# Patient Record
Sex: Female | Born: 1965 | Race: Black or African American | Hispanic: No | Marital: Married | Smoking: Current every day smoker
Health system: Southern US, Community
[De-identification: ages and names within clinical notes are randomized; demographics above are authoritative.]

## PROBLEM LIST (undated history)

## (undated) DIAGNOSIS — I10 Essential (primary) hypertension: Secondary | ICD-10-CM

## (undated) HISTORY — PX: CARPAL TUNNEL RELEASE: SHX101

## (undated) HISTORY — PX: TUBAL LIGATION: SHX77

---

## 2010-12-07 DIAGNOSIS — Z780 Asymptomatic menopausal state: Secondary | ICD-10-CM | POA: Insufficient documentation

## 2010-12-07 DIAGNOSIS — F17200 Nicotine dependence, unspecified, uncomplicated: Secondary | ICD-10-CM | POA: Insufficient documentation

## 2012-12-07 DIAGNOSIS — I10 Essential (primary) hypertension: Secondary | ICD-10-CM | POA: Insufficient documentation

## 2013-12-12 DIAGNOSIS — T502X5A Adverse effect of carbonic-anhydrase inhibitors, benzothiadiazides and other diuretics, initial encounter: Secondary | ICD-10-CM

## 2013-12-12 DIAGNOSIS — E876 Hypokalemia: Secondary | ICD-10-CM | POA: Insufficient documentation

## 2016-02-26 DIAGNOSIS — E669 Obesity, unspecified: Secondary | ICD-10-CM | POA: Insufficient documentation

## 2016-02-26 DIAGNOSIS — K581 Irritable bowel syndrome with constipation: Secondary | ICD-10-CM | POA: Insufficient documentation

## 2016-07-07 DIAGNOSIS — J302 Other seasonal allergic rhinitis: Secondary | ICD-10-CM | POA: Insufficient documentation

## 2017-04-12 ENCOUNTER — Ambulatory Visit
Admission: EM | Admit: 2017-04-12 | Discharge: 2017-04-12 | Disposition: A | Discharge status: Home / Self Care | Payer: BLUE CROSS/BLUE SHIELD | Attending: Family Medicine | Admitting: Family Medicine

## 2017-04-12 ENCOUNTER — Encounter: Payer: Self-pay | Admitting: Emergency Medicine

## 2017-04-12 ENCOUNTER — Other Ambulatory Visit: Payer: Self-pay

## 2017-04-12 DIAGNOSIS — M5442 Lumbago with sciatica, left side: Secondary | ICD-10-CM | POA: Diagnosis not present

## 2017-04-12 HISTORY — DX: Essential (primary) hypertension: I10

## 2017-04-12 MED ORDER — CYCLOBENZAPRINE HCL 10 MG PO TABS: 10.0000 mg | ORAL_TABLET | Freq: Two times a day (BID) | ORAL | 0 refills | Status: DC | PRN

## 2017-04-12 MED ORDER — MELOXICAM 15 MG PO TABS: 15.0000 mg | ORAL_TABLET | Freq: Every day | ORAL | 0 refills | Status: DC | PRN

## 2017-04-12 NOTE — ED Provider Notes (Addendum)
[facility] ____________________________________________  Time seen: Approximately 0954 AM  I have reviewed the triage vital signs and the nursing notes.   HISTORY  Chief Complaint Back Pain   HPI Jamie Estrada is a 52 y.o. female presenting for evaluation of left lower back pain that is been present since Monday when she woke up, with intermittent radiation down left leg.  States that she has a history of some similar pain happening in the past, but reports this feels like it is lasting longer.  States she has been exercising more recently and was last exercising on Sunday.  Denies any fall, direct injury or direct trauma.  States she woke up with some pain in the same area on Monday, but also reports Monday morning she coughed really hard and felt like she pulled her back additionally.  States pain going down left leg is intermittent, not constant.  Denies any urinary or bowel retention or incontinence, paresthesias, decreased range of motion, extremity swelling or rash.  Reports otherwise feels well.  Reports continues to eat and drink well.  States has taken some over-the-counter Excedrin occasionally without much change.  States pain improves with rest, increases with bending. Denies chest pain, shortness of breath, abdominal pain, dysuria, extremity swelling or rash. Denies recent sickness. Denies recent antibiotic use.  Denies renal insufficiency.  Denies cardiac history.   Past Medical History:  Diagnosis Date  . Hypertension     There are no active problems to display for this patient.   Past Surgical History:  Procedure Laterality Date  . CARPAL TUNNEL RELEASE    . TUBAL LIGATION       No current facility-administered medications for this encounter.   Current Outpatient Medications:  .  losartan-hydrochlorothiazide (HYZAAR) 50-12.5 MG tablet, Take 1 tablet by mouth daily., Disp: , Rfl:  .  potassium chloride SA (K-DUR,KLOR-CON) 20 MEQ tablet, Take 20 mEq  by mouth once., Disp: , Rfl:  .  cyclobenzaprine (FLEXERIL) 10 MG tablet, Take 1 tablet (10 mg total) by mouth 2 (two) times daily as needed for muscle spasms. Do not drive while taking as can cause drowsiness, Disp: 15 tablet, Rfl: 0 .  meloxicam (MOBIC) 15 MG tablet, Take 1 tablet (15 mg total) by mouth daily as needed., Disp: 10 tablet, Rfl: 0  Allergies Patient has no known allergies.  Family History  Problem Relation Age of Onset  . Diabetes Mother   . Heart attack Mother   . Hypertension Father     Social History Social History   Tobacco Use  . Smoking status: Current Every Day Smoker  . Smokeless tobacco: Never Used  . Tobacco comment: 2 cigarettes/day  Substance Use Topics  . Alcohol use: Yes    Comment: socially  . Drug use: No    Review of Systems Constitutional: No fever/chills Cardiovascular: Denies chest pain. Respiratory: Denies shortness of breath. Gastrointestinal: No abdominal pain.  Genitourinary: Negative for dysuria. Musculoskeletal: Positive for back pain. Skin: Negative for rash.  ____________________________________________   PHYSICAL EXAM:  VITAL SIGNS: ED Triage Vitals [04/12/17 0920]  Enc Vitals Group     BP 127/68     Pulse Rate 67     Resp 16     Temp (!) 97.4 F (36.3 C)     Temp Source Oral     SpO2 100 %     Weight 220 lb (99.8 kg)     Height 5\' 5"  (1.651 m)     Head Circumference  Peak Flow      Pain Score 10     Pain Loc      Pain Edu?      Excl. in [place]?     Constitutional: Alert and oriented. Well appearing and in no acute distress. Cardiovascular: Normal rate, regular rhythm. Grossly normal heart sounds.  Good peripheral circulation. Respiratory: Normal respiratory effort without tachypnea nor retractions. Breath sounds are clear and equal bilaterally. No wheezes, rales, rhonchi. Gastrointestinal: Soft and nontender. N Musculoskeletal:   No midline cervical, thoracic or lumbar tenderness to palpation. Bilateral  pedal pulses equal and easily palpated.      Right lower leg:  No tenderness or edema.      Left lower leg:  No tenderness or edema.  Except: No midline lumbar tenderness palpation, mild to moderate left paralumbar tenderness, moderate tenderness along the left piriformis and sciatic notch, no rash, no lesions, pain radiation present with palpation to sciatic notch, no saddle anesthesia, changes positions quickly in room, mild pain with standing left knee lift, no pain with right knee lift, bilateral plantar flexion dorsiflexion strong and equal, ambulatory with steady gait,   No pain with lumbar right or left rotation, mild pain with lumbar flexion and extension with slightly limited flexion. Neurologic:  Normal speech and language. No gross focal neurologic deficits are appreciated. Speech is normal. No gait instability.  Skin:  Skin is warm, dry and intact. No rash noted. Psychiatric: Mood and affect are normal. Speech and behavior are normal. Patient exhibits appropriate insight and judgment   ___________________________________________   LABS (all labs ordered are listed, but only abnormal results are displayed)  Labs Reviewed - No data to display  RADIOLOGY  No results found. ____________________________________________   PROCEDURES Procedures   INITIAL IMPRESSION / ASSESSMENT AND PLAN / ED COURSE  Pertinent labs & imaging results that were available during my care of the patient were reviewed by me and considered in my medical decision making (see chart for details).  Well-appearing patient.  No acute distress.  Suspect recent overuse from exercise and straining injury leading to inflammation sciatica.  Will treat patient oral daily Mobic and as needed Flexeril.  Encourage rest, fluids, supportive care, stretching.Discussed indication, risks and benefits of medications with patient.  Discussed follow up with Primary care physician this week. Discussed follow up and return  parameters including no resolution or any worsening concerns. Patient verbalized understanding and agreed to plan.   ____________________________________________   FINAL CLINICAL IMPRESSION(S) / ED DIAGNOSES  Final diagnoses:  Acute left-sided low back pain with left-sided sciatica     ED Discharge Orders        Ordered    meloxicam (MOBIC) 15 MG tablet  Daily PRN     04/12/17 1010    cyclobenzaprine (FLEXERIL) 10 MG tablet  2 times daily PRN     04/12/17 1010       Note: This dictation was prepared with Dragon dictation along with smaller phrase technology. Any transcriptional errors that result from this process are unintentional.         Marylene Land, NP 04/12/17 Culbertson, Lindy, NP 04/12/17 1042

## 2017-04-12 NOTE — ED Triage Notes (Signed)
Patient in today with a 2 day history of left sided back pain radiating into her left leg. Patient has tried OTC Excedrine and Ibuprofen without relief.

## 2017-04-12 NOTE — Discharge Instructions (Signed)
Take medication as prescribed. Rest. Drink plenty of fluids.  ° °Follow up with your primary care physician this week as needed. Return to Urgent care for new or worsening concerns.  ° °

## 2017-04-15 ENCOUNTER — Telehealth: Payer: Self-pay

## 2017-04-15 NOTE — Telephone Encounter (Signed)
Called to follow up with patient since visit here at [facility]. Patient instructed to call back with any questions or concerns. MAH  

## 2017-06-07 ENCOUNTER — Other Ambulatory Visit: Payer: Self-pay | Admitting: Podiatry

## 2017-06-07 DIAGNOSIS — D480 Neoplasm of uncertain behavior of bone and articular cartilage: Secondary | ICD-10-CM

## 2017-06-27 ENCOUNTER — Ambulatory Visit
Admission: RE | Admit: 2017-06-27 | Discharge: 2017-06-27 | Disposition: A | Discharge status: Home / Self Care | Payer: BLUE CROSS/BLUE SHIELD | Source: Ambulatory Visit | Attending: Podiatry | Admitting: Podiatry

## 2017-06-27 ENCOUNTER — Encounter (INDEPENDENT_AMBULATORY_CARE_PROVIDER_SITE_OTHER): Payer: Self-pay

## 2017-06-27 DIAGNOSIS — D480 Neoplasm of uncertain behavior of bone and articular cartilage: Secondary | ICD-10-CM | POA: Diagnosis present

## 2017-06-27 DIAGNOSIS — M79671 Pain in right foot: Secondary | ICD-10-CM | POA: Insufficient documentation

## 2017-07-12 ENCOUNTER — Other Ambulatory Visit: Payer: Self-pay | Admitting: Podiatry

## 2017-07-14 ENCOUNTER — Encounter: Payer: Self-pay | Admitting: Emergency Medicine

## 2017-07-14 ENCOUNTER — Ambulatory Visit
Admission: EM | Admit: 2017-07-14 | Discharge: 2017-07-14 | Disposition: A | Discharge status: Home / Self Care | Payer: BLUE CROSS/BLUE SHIELD | Attending: Emergency Medicine | Admitting: Emergency Medicine

## 2017-07-14 ENCOUNTER — Other Ambulatory Visit: Payer: Self-pay

## 2017-07-14 DIAGNOSIS — R1032 Left lower quadrant pain: Secondary | ICD-10-CM

## 2017-07-14 DIAGNOSIS — K5900 Constipation, unspecified: Secondary | ICD-10-CM | POA: Diagnosis not present

## 2017-07-14 LAB — URINALYSIS, COMPLETE (UACMP) WITH MICROSCOPIC
BACTERIA UA: NONE SEEN
Bilirubin Urine: NEGATIVE
Glucose, UA: NEGATIVE mg/dL
Hgb urine dipstick: NEGATIVE
Leukocytes, UA: NEGATIVE
Nitrite: NEGATIVE
PH: 5 (ref 5.0–8.0)
Protein, ur: NEGATIVE mg/dL
RBC / HPF: NONE SEEN RBC/hpf (ref 0–5)
SPECIFIC GRAVITY, URINE: 1.025 (ref 1.005–1.030)

## 2017-07-14 MED ORDER — GLYCERIN (ADULT) 2 G RE SUPP: 1.0000 | Freq: Once | RECTAL | 0 refills | Status: DC | PRN

## 2017-07-14 MED ORDER — SENNOSIDES-DOCUSATE SODIUM 8.6-50 MG PO TABS: 1.0000 | ORAL_TABLET | Freq: Every day | ORAL | 0 refills | Status: DC

## 2017-07-14 NOTE — Discharge Instructions (Addendum)
Drink extra fluids. It may take up to 3 days for the miralax to take effect. may also drink prune and apple juice. Return to the ER if you have a fever, if you start having severe abdominal pain, a fever >100.4, or any other concerns.   Go to www.goodrx.com to look up your medications. This will give you a list of where you can find your prescriptions at the most affordable prices. Or ask the pharmacist what the cash price is, or if they have any other discount programs available to help make your medication more affordable. This can be less expensive than what you would pay with insurance.

## 2017-07-14 NOTE — ED Triage Notes (Signed)
Patient states she is having bad abdominal pain and has been since Monday

## 2017-07-14 NOTE — ED Provider Notes (Signed)
HPI  SUBJECTIVE:  Jamie Estrada is a 52 y.o. female who presents with 5 days of midline left lower quadrant aching, stabbing, constant abdominal/pelvic pain.  States that it waxes and wanes.  States her belly feels swollen.  States that she has had constipation for the past 5 days.  She last passed stool 5 days ago.  States that she passed only a small amount of hard stool.   She reports urinary frequency, denies dysuria, urgency, cloudy odorous urine, hematuria.  No nausea, vomiting, fevers, anorexia, back pain.  No distention.  No vaginal bleeding, odor, discharge, rash.  She is in a long-term monogamous relationship with her husband who is asymptomatic.  STDs are not a concern today.  She tried ibuprofen with improvement in her symptoms.  She also tried MiraLAX once.  She states symptoms are worse with coughing and trying to stool.  She states that the car ride over here was not painful.  States that she gets constipated approximately once a month and states that this feels similar to previous episodes of constipation.  She drinks a liter and a half of water a day.  She has a past medical history of hypertension.  No history of diabetes, opiate use, abdominal surgeries, diverticulosis, UTI, pyelonephritis, nephrolithiasis.  ZOX:WRUEAVW, Santiago Glad, MD   Past Medical History:  Diagnosis Date  . Hypertension     Past Surgical History:  Procedure Laterality Date  . CARPAL TUNNEL RELEASE    . TUBAL LIGATION      Family History  Problem Relation Age of Onset  . Diabetes Mother   . Heart attack Mother   . Hypertension Father     Social History   Tobacco Use  . Smoking status: Current Every Day Smoker  . Smokeless tobacco: Never Used  . Tobacco comment: 2 cigarettes/day  Substance Use Topics  . Alcohol use: Yes    Comment: socially  . Drug use: No    No current facility-administered medications for this encounter.   Current Outpatient Medications:  .  losartan-hydrochlorothiazide  (HYZAAR) 50-12.5 MG tablet, Take 1 tablet by mouth daily., Disp: , Rfl:  .  potassium chloride SA (K-DUR,KLOR-CON) 20 MEQ tablet, Take 20 mEq by mouth once., Disp: , Rfl:  .  cyclobenzaprine (FLEXERIL) 10 MG tablet, Take 1 tablet (10 mg total) by mouth 2 (two) times daily as needed for muscle spasms. Do not drive while taking as can cause drowsiness, Disp: 15 tablet, Rfl: 0 .  glycerin adult 2 g suppository, Place 1 suppository rectally once as needed (constipation)., Disp: 12 suppository, Rfl: 0 .  meloxicam (MOBIC) 15 MG tablet, Take 1 tablet (15 mg total) by mouth daily as needed., Disp: 10 tablet, Rfl: 0 .  senna-docusate (SENOKOT-S) 8.6-50 MG tablet, Take 1 tablet by mouth daily., Disp: 10 tablet, Rfl: 0  No Known Allergies   ROS  As noted in HPI.   Physical Exam  BP 122/79 (BP Location: Left Arm)   Pulse 81   Temp 98.3 F (36.8 C) (Oral)   Resp 18   Ht 5\' 5"  (1.651 m)   Wt 220 lb (99.8 kg)   SpO2 100%   BMI 36.61 kg/m   Constitutional: Well developed, well nourished, no acute distress Eyes:  EOMI, conjunctiva normal bilaterally HENT: Normocephalic, atraumatic,mucus membranes moist Respiratory: Normal inspiratory effort Cardiovascular: Normal rate GI: Well appearance.  Soft, mild left lower quadrant tenderness.  No guarding, rebound.  Negative Murphy, negative McBurney.  Negative tap table test. Back: No CVAT  Rectal: Hard stool in vault.  No impaction. skin: No rash, skin intact Musculoskeletal: no deformities Neurologic: Alert & oriented x 3, no focal neuro deficits Psychiatric: Speech and behavior appropriate   ED Course   Medications - No data to display  Orders Placed This Encounter  Procedures  . Urinalysis, Complete w Microscopic    Standing Status:   Standing    Number of Occurrences:   1    Results for orders placed or performed during the hospital encounter of 07/14/17 (from the past 24 hour(s))  Urinalysis, Complete w Microscopic     Status:  Abnormal   Collection Time: 07/14/17  8:37 PM  Result Value Ref Range   Color, Urine YELLOW YELLOW   APPearance CLEAR CLEAR   Specific Gravity, Urine 1.025 1.005 - 1.030   pH 5.0 5.0 - 8.0   Glucose, UA NEGATIVE NEGATIVE mg/dL   Hgb urine dipstick NEGATIVE NEGATIVE   Bilirubin Urine NEGATIVE NEGATIVE   Ketones, ur TRACE (A) NEGATIVE mg/dL   Protein, ur NEGATIVE NEGATIVE mg/dL   Nitrite NEGATIVE NEGATIVE   Leukocytes, UA NEGATIVE NEGATIVE   Squamous Epithelial / LPF 0-5 0 - 5   WBC, UA 0-5 0 - 5 WBC/hpf   RBC / HPF NONE SEEN 0 - 5 RBC/hpf   Bacteria, UA NONE SEEN NONE SEEN   Mucus PRESENT    No results found.  ED Clinical Impression  Constipation, unspecified constipation type  Left lower quadrant pain   ED Assessment/Plan  Her abdomen is benign.  Her UA is negative for UTI.  Presentation is most consistent with constipation.  Advised MiraLAX, increase fluids, apple juice, will send home with glycerin suppositories, Senokot.  magnesium citrate if necessary.  Discussed labs,MDM, treatment plan, and plan for follow-up with patient. Discussed sn/sx that should prompt return to the ED. patient agrees with plan.   Meds ordered this encounter  Medications  . glycerin adult 2 g suppository    Sig: Place 1 suppository rectally once as needed (constipation).    Dispense:  12 suppository    Refill:  0  . senna-docusate (SENOKOT-S) 8.6-50 MG tablet    Sig: Take 1 tablet by mouth daily.    Dispense:  10 tablet    Refill:  0    *This clinic note was created using [employer]. Therefore, there may be occasional mistakes despite careful proofreading.   ?   Melynda Ripple, MD 07/14/17 2108

## 2017-08-08 ENCOUNTER — Other Ambulatory Visit: Payer: Self-pay | Admitting: Podiatry

## 2017-08-08 DIAGNOSIS — D1809 Hemangioma of other sites: Secondary | ICD-10-CM

## 2017-08-24 ENCOUNTER — Other Ambulatory Visit: Payer: BLUE CROSS/BLUE SHIELD

## 2017-09-21 ENCOUNTER — Other Ambulatory Visit: Payer: BLUE CROSS/BLUE SHIELD

## 2017-10-10 ENCOUNTER — Other Ambulatory Visit: Payer: BLUE CROSS/BLUE SHIELD

## 2017-11-02 DIAGNOSIS — F4323 Adjustment disorder with mixed anxiety and depressed mood: Secondary | ICD-10-CM | POA: Insufficient documentation

## 2017-11-08 ENCOUNTER — Other Ambulatory Visit: Payer: BLUE CROSS/BLUE SHIELD

## 2017-11-29 ENCOUNTER — Other Ambulatory Visit: Payer: BLUE CROSS/BLUE SHIELD

## 2017-12-14 ENCOUNTER — Ambulatory Visit
Admission: RE | Admit: 2017-12-14 | Discharge: 2017-12-14 | Disposition: A | Discharge status: Home / Self Care | Payer: BLUE CROSS/BLUE SHIELD | Source: Ambulatory Visit | Attending: Podiatry | Admitting: Podiatry

## 2017-12-14 ENCOUNTER — Encounter: Payer: Self-pay | Admitting: *Deleted

## 2017-12-14 DIAGNOSIS — D1809 Hemangioma of other sites: Secondary | ICD-10-CM

## 2017-12-14 HISTORY — PX: IR RADIOLOGIST EVAL & MGMT: IMG5224

## 2017-12-14 NOTE — Consult Note (Signed)
Chief Complaint: I have foot pain and swelling  Referring Physician(s): Fowler,Justin  History of Present Illness: Jamie Estrada is a 52 y.o. female presenting as a scheduled consultation today to Vascular [facility] Inter[facility] Radiology, kindly referred by Dr. Vickki Muff, for evaluation for right foot pain and MRI finding of a possible vascular malformation.   Ms Jamie Estrada is here today by herself for the interview.  She lives in [place], [place].    She tells me that she had right foot pain and a "spot" surgically resected from the right foot in 1999 at [facility] system by orthopedic surgery.  She is uncertain of the diagnosis at that time.  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    When I asked, she tells me that she does remember some vague memory of having right foot pain and swelling at the time of her child's pregnancy 30 years ago, that improved after giving birth.  She does not remember any other symptoms of her foot previously.   The symptoms persist today.  She has bilateral leg swelling, but the pain in the right foot is very debilitating.  She denies pain in the left foot.  She has compression stockings, and though not wearing them now, she tells me that she does wear them.   The pain is "12/10" intensity at its worst.  It is of burning, aching, sharp quality.  Worst at the end of the day.  The pain started 5-6 years after her surgery, and has been worsening over time.    She continues to smoke about 1/2 pack per day.  She has quit a few times but restarted.  She started smoking in her 30's.    Past Medical History:  Diagnosis Date  . Hypertension     Past Surgical History:  Procedure Laterality Date  . CARPAL TUNNEL RELEASE    . TUBAL LIGATION      Allergies: Patient has no  known allergies.  Medications: Prior to Admission medications   Medication Sig Start Date End Date Taking? Authorizing Provider  cyclobenzaprine (FLEXERIL) 10 MG tablet Take 1 tablet (10 mg total) by mouth 2 (two) times daily as needed for muscle spasms. Do not drive while taking as can cause drowsiness 04/12/17  Yes Marylene Land, NP  losartan-hydrochlorothiazide (HYZAAR) 50-12.5 MG tablet Take 1 tablet by mouth daily.   Yes [provider]  potassium chloride SA (K-DUR,KLOR-CON) 20 MEQ tablet Take 20 mEq by mouth once.   Yes [provider]  senna-docusate (SENOKOT-S) 8.6-50 MG tablet Take 1 tablet by mouth daily. 07/14/17  Yes Melynda Ripple, MD     Family History  Problem Relation Age of Onset  . Diabetes Mother   . Heart attack Mother   . Hypertension Father     Social History   Socioeconomic History  . Marital status: Married    Spouse name: Not on file  . Number of children: Not on file  . Years of education: Not on file  . Highest education level: Not on file  Occupational History  . Not on file  Social Needs  . Financial resource strain: Not on file  . Food insecurity:    Worry: Not on file    Inability: Not on file  . Transportation needs:  Medical: Not on file    Non-medical: Not on file  Tobacco Use  . Smoking status: Current Every Day Smoker  . Smokeless tobacco: Never Used  . Tobacco comment: 2 cigarettes/day  Substance and Sexual Activity  . Alcohol use: Yes    Comment: socially  . Drug use: No  . Sexual activity: Not on file  Lifestyle  . Physical activity:    Days per week: Not on file    Minutes per session: Not on file  . Stress: Not on file  Relationships  . Social connections:    Talks on phone: Not on file    Gets together: Not on file    Attends religious service: Not on file    Active member of club or organization: Not on file    Attends meetings of clubs or organizations: Not on file    Relationship status: Not  on file  Other Topics Concern  . Not on file  Social History Narrative  . Not on file     Review of Systems: A 12 point ROS discussed and pertinent positives are indicated in the HPI above.  All other systems are negative.  Review of Systems  Vital Signs: BP (!) 146/93   Pulse 85   Temp 98.1 F (36.7 C) (Oral)   Resp 15   Ht 5\' 5"  (1.651 m)   Wt 99.8 kg   SpO2 99%   BMI 36.61 kg/m   Physical Exam General: 52 yo AAfemale appearing younger than stated age.  Well-developed, well-nourished.  No distress. HEENT: Atraumatic, normocephalic.  Conjugate gaze, extra-ocular motor intact. No scleral icterus or scleral injection. No lesions on external ears, nose, lips, or gums.  Oral mucosa moist, pink. Wearing glasses.  Neck: Symmetric with no goiter enlargement.  Chest/Lungs:  Symmetric chest with inspiration/expiration.  No labored breathing.  Clear to auscultation with no wheezes, rhonchi, or rales.  Heart:  RRR, with no third heart sounds appreciated. No JVD appreciated.  Abdomen:  Soft, NT/ND, with + bowel sounds.   Genito-urinary: Deferred Neurologic: Alert & Oriented to person, place, and time.   Normal affect and insight.  Appropriate questions.  Moving all 4 extremities with gross sensory intact.  Pulse Exam:  No bruit appreciated.  Palpable radial and pedal pulses. Extremities: No wounds on the extremity.  Surgical scar on the dorsum of the right foot.  Right foot circumference: 23cm at the metatarsal heads 24.5cm at the instep Left foot circumference: 23cm at the metatarsal heads 24.5cm at the instep  No thrill palpated.  No bruit on the doppler at the forefoot.   Mallampati Score:     Imaging: No results found.  Labs:  CBC: No results for input(s): WBC, HGB, HCT, PLT in the last 8760 hours.  COAGS: No results for input(s): INR, APTT in the last 8760 hours.  BMP: No results for input(s): NA, K, CL, CO2, GLUCOSE, BUN, CALCIUM, CREATININE, GFRNONAA, GFRAA in  the last 8760 hours.  Invalid input(s): CMP  LIVER FUNCTION TESTS: No results for input(s): BILITOT, AST, ALT, ALKPHOS, PROT, ALBUMIN in the last 8760 hours.  TUMOR MARKERS: No results for input(s): AFPTM, CEA, CA199, CHROMGRNA in the last 8760 hours.  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    The majority of our discussion today was an overview of vascular malformations, the low-flow/high-flow categories, natural history,  and treatment options.  I suspect that if her lesion is a vascular malformation, it would be, in the ISSVA classification, a venous malformation or mixed venous/lymphatic malformation.  Given her prior surgery at this site, this could be post operative or a recurrence of a non-vascular lesion.  We do not have pathology of her prior resection.    Given her significant symptoms, she is motivated to get treated, if possible. I believe, then, it is indicated to plan for a venogram to study this lesion.  This may include an IV in the foot for reflux to the deep system, and/or direct puncture of the lesion for images.  I do not think that we will need to do an angiogram, as I am not suspecting any high-flow component if it is a vascular lesion.   I also discussed with her the plan for a second session to do the treatment, that would allow [place] time to study the images.  This is important to make sure we offer a low risk treatment.    Specific risks that were discussed regarding any potential percutaneous embolization/sclerotherpy that were discussed included: bleeding, infection, need for additional procedure, local inflammation, nerve injury, tissue injury/necrosis, further surgery, DVT or thromboembolic disease, need for hospitalization, cardiopulmonary collapse, death.    After our discussion, she would like to proceed with the planning venogram, and  potentially a treatment.    Plan: - plan for right lower extremity venogram, likely with foot IV and with US guided puncture of the lesion for images - plan for any future treatment on a later date - continue compression stocking therapy - I have advised her to observe all of her other doctors appointments  Thank you for this interesting consult.  I greatly enjoyed meeting Ebonique Sivils and look forward to participating in their care.  A copy of this report was sent to the requesting provider on this date.  Electronically Signed: Corrie Mckusick 12/14/2017, 3:36 PM   I spent a total of  40 Minutes   in face to face in clinical consultation, greater than 50% of which was counseling/coordinating care for right foot pain and swelling, possible right foot and leg venogram, with possible percutaneous embolization/sclerotherapy of vascular malformation

## 2017-12-20 ENCOUNTER — Other Ambulatory Visit (HOSPITAL_COMMUNITY): Payer: Self-pay | Admitting: Interventional Radiology

## 2017-12-20 DIAGNOSIS — M79671 Pain in right foot: Secondary | ICD-10-CM

## 2017-12-28 ENCOUNTER — Ambulatory Visit (HOSPITAL_COMMUNITY): Payer: BLUE CROSS/BLUE SHIELD

## 2018-01-02 ENCOUNTER — Telehealth (HOSPITAL_COMMUNITY): Payer: Self-pay

## 2018-01-02 NOTE — Telephone Encounter (Signed)
Called to schedule venogram, no answer, left vm. AW  

## 2018-01-11 ENCOUNTER — Other Ambulatory Visit: Payer: Self-pay | Admitting: Radiology

## 2018-01-12 ENCOUNTER — Other Ambulatory Visit (HOSPITAL_COMMUNITY): Payer: Self-pay | Admitting: Interventional Radiology

## 2018-01-12 ENCOUNTER — Encounter (HOSPITAL_COMMUNITY): Payer: Self-pay

## 2018-01-12 ENCOUNTER — Ambulatory Visit (HOSPITAL_COMMUNITY)
Admission: RE | Admit: 2018-01-12 | Discharge: 2018-01-12 | Disposition: A | Discharge status: Home / Self Care | Payer: BLUE CROSS/BLUE SHIELD | Source: Ambulatory Visit | Attending: Interventional Radiology | Admitting: Interventional Radiology

## 2018-01-12 DIAGNOSIS — I10 Essential (primary) hypertension: Secondary | ICD-10-CM | POA: Diagnosis not present

## 2018-01-12 DIAGNOSIS — Z8249 Family history of ischemic heart disease and other diseases of the circulatory system: Secondary | ICD-10-CM | POA: Insufficient documentation

## 2018-01-12 DIAGNOSIS — F17210 Nicotine dependence, cigarettes, uncomplicated: Secondary | ICD-10-CM | POA: Diagnosis not present

## 2018-01-12 DIAGNOSIS — Q279 Congenital malformation of peripheral vascular system, unspecified: Secondary | ICD-10-CM | POA: Diagnosis present

## 2018-01-12 DIAGNOSIS — M79671 Pain in right foot: Secondary | ICD-10-CM | POA: Diagnosis not present

## 2018-01-12 HISTORY — PX: IR US GUIDE VASC ACCESS RIGHT: IMG2390

## 2018-01-12 HISTORY — PX: IR VENO/EXT/UNI RIGHT: IMG676

## 2018-01-12 LAB — BASIC METABOLIC PANEL
Anion gap: 7 (ref 5–15)
BUN: 13 mg/dL (ref 6–20)
CALCIUM: 9 mg/dL (ref 8.9–10.3)
CHLORIDE: 108 mmol/L (ref 98–111)
CO2: 26 mmol/L (ref 22–32)
CREATININE: 0.72 mg/dL (ref 0.44–1.00)
GFR calc non Af Amer: >60 mL/min (ref >60)
Glucose, Bld: 92 mg/dL (ref 70–99)
Potassium: 3.6 mmol/L (ref 3.5–5.1)
SODIUM: 141 mmol/L (ref 135–145)

## 2018-01-12 LAB — PROTIME-INR
INR: 1.17
Prothrombin Time: 14.8 seconds (ref 11.4–15.2)

## 2018-01-12 LAB — CBC
HCT: 41.4 % (ref 36.0–46.0)
Hemoglobin: 13 g/dL (ref 12.0–15.0)
MCH: 27.3 pg (ref 26.0–34.0)
MCHC: 31.4 g/dL (ref 30.0–36.0)
MCV: 87 fL (ref 80.0–100.0)
NRBC: 0 % (ref 0.0–0.2)
PLATELETS: 301 10*3/uL (ref 150–400)
RBC: 4.76 MIL/uL (ref 3.87–5.11)
RDW: 14.6 % (ref 11.5–15.5)
WBC: 4.6 10*3/uL (ref 4.0–10.5)

## 2018-01-12 LAB — APTT: aPTT: 28 seconds (ref 24–36)

## 2018-01-12 MED ORDER — MIDAZOLAM HCL 2 MG/2ML IJ SOLN
INTRAMUSCULAR | Status: AC | PRN
  Administered 2018-01-12 (×2): 0.5 mg via INTRAVENOUS
  Administered 2018-01-12 (×2): 1 mg via INTRAVENOUS

## 2018-01-12 MED ORDER — MIDAZOLAM HCL 2 MG/2ML IJ SOLN
INTRAMUSCULAR | Status: AC
  Filled 2018-01-12: qty 2

## 2018-01-12 MED ORDER — IODIXANOL 320 MG/ML IV SOLN: 40.0000 mL | Freq: Once | INTRAVENOUS | Status: DC | PRN

## 2018-01-12 MED ORDER — FENTANYL CITRATE (PF) 100 MCG/2ML IJ SOLN
INTRAMUSCULAR | Status: AC | PRN
  Administered 2018-01-12: 25 ug via INTRAVENOUS
  Administered 2018-01-12: 50 ug via INTRAVENOUS
  Administered 2018-01-12: 25 ug via INTRAVENOUS

## 2018-01-12 MED ORDER — LIDOCAINE HCL 1 % IJ SOLN
INTRAMUSCULAR | Status: AC
  Filled 2018-01-12: qty 20

## 2018-01-12 MED ORDER — SODIUM CHLORIDE 0.9 % IV SOLN: INTRAVENOUS | Status: DC

## 2018-01-12 MED ORDER — LIDOCAINE HCL (PF) 1 % IJ SOLN
INTRAMUSCULAR | Status: AC | PRN
  Administered 2018-01-12: 5 mL

## 2018-01-12 MED ORDER — FENTANYL CITRATE (PF) 100 MCG/2ML IJ SOLN
INTRAMUSCULAR | Status: AC
  Filled 2018-01-12: qty 2

## 2018-01-12 NOTE — Discharge Instructions (Signed)
Venogram, Care After °This sheet gives you information about how to care for yourself after your procedure. Your health care provider may also give you more specific instructions. If you have problems or questions, contact your health care provider. °What can I expect after the procedure? °After the procedure, it is common to have: °· Bruising or mild discomfort in the area where the IV was inserted (insertion site). ° °Follow these instructions at home: °Eating and drinking °· Follow instructions from your health care provider about eating or drinking restrictions. °· Drink a lot of fluids for the first several days after the procedure, as directed by your health care provider. This helps to wash (flush) the contrast out of your body. Examples of healthy fluids include water or low-calorie drinks. °General instructions °· Check your IV insertion area every day for signs of infection. Check for: °? Redness, swelling, or pain. °? Fluid or blood. °? Warmth. °? Pus or a bad smell. °· Take over-the-counter and prescription medicines only as told by your health care provider. °· Rest and return to your normal activities as told by your health care provider. Ask your health care provider what activities are safe for you. °· Do not drive for 24 hours if you were given a medicine to help you relax (sedative), or until your health care provider approves. °· Keep all follow-up visits as told by your health care provider. This is important. °Contact a health care provider if: °· Your skin becomes itchy or you develop a rash or hives. °· You have a fever that does not get better with medicine. °· You feel nauseous. °· You vomit. °· You have redness, swelling, or pain around the insertion site. °· You have fluid or blood coming from the insertion site. °· Your insertion area feels warm to the touch. °· You have pus or a bad smell coming from the insertion site. °Get help right away if: °· You have difficulty breathing or  shortness of breath. °· You develop chest pain. °· You faint. °· You feel very dizzy. °These symptoms may represent a serious problem that is an emergency. Do not wait to see if the symptoms will go away. Get medical help right away. Call your local emergency services (911 in the [place] Do not drive yourself to the hospital. °Summary °· After your procedure, it is common to have bruising or mild discomfort in the area where the IV was inserted. °· You should check your IV insertion area every day for signs of infection. °· Take over-the-counter and prescription medicines only as told by your health care provider. °· You should drink a lot of fluids for the first several days after the procedure to help flush the contrast from your body. °This information is not intended to replace advice given to you by your health care provider. Make sure you discuss any questions you have with your health care provider. °Document Released: 12/19/2012 Document Revised: 01/23/2016 Document Reviewed: 01/23/2016 °Elsevier Interactive Patient Education © 2017 [employer] ° °Moderate Conscious Sedation, Adult, Care After °These instructions provide you with information about caring for yourself after your procedure. Your health care provider may also give you more specific instructions. Your treatment has been planned according to current medical practices, but problems sometimes occur. Call your health care provider if you have any problems or questions after your procedure. °What can I expect after the procedure? °After your procedure, it is common: °· To feel sleepy for several hours. °· To feel   clumsy and have poor balance for several hours. °· To have poor judgment for several hours. °· To vomit if you eat too soon. ° °Follow these instructions at home: °For at least 24 hours after the procedure: ° °· Do not: °? Participate in activities where you could fall or become injured. °? Drive. °? Use heavy machinery. °? Drink  alcohol. °? Take sleeping pills or medicines that cause drowsiness. °? Make important decisions or sign legal documents. °? Take care of children on your own. °· Rest. °Eating and drinking °· Follow the diet recommended by your health care provider. °· If you vomit: °? Drink water, juice, or soup when you can drink without vomiting. °? Make sure you have little or no nausea before eating solid foods. °General instructions °· Have a responsible adult stay with you until you are awake and alert. °· Take over-the-counter and prescription medicines only as told by your health care provider. °· If you smoke, do not smoke without supervision. °· Keep all follow-up visits as told by your health care provider. This is important. °Contact a health care provider if: °· You keep feeling nauseous or you keep vomiting. °· You feel light-headed. °· You develop a rash. °· You have a fever. °Get help right away if: °· You have trouble breathing. °This information is not intended to replace advice given to you by your health care provider. Make sure you discuss any questions you have with your health care provider. °Document Released: 12/19/2012 Document Revised: 08/03/2015 Document Reviewed: 06/20/2015 °Elsevier Interactive Patient Education © 2018 [employer] ° °

## 2018-01-12 NOTE — Procedures (Signed)
Interventional Radiology Procedure Note  Procedure: US guided venous access right foot vein, imaging guided direct puncture of venous malformation, venogram.    Complications: None  Recommendations:  - 1 hour recovery - Do not submerge for 7 days - Routine wound care - follow up with Dr. Earleen Newport in the clinic in ~4 weeks   Signed,  Dulcy Fanny. Earleen Newport, DO

## 2018-01-12 NOTE — H&P (Signed)
Chief Complaint: Patient was seen in consultation today for right leg Venogram at the request of Dr Jamie Estrada   Supervising Physician: Jamie Estrada  Patient Status: [facility] - Out-pt  History of Present Illness: Jamie Estrada is a 52 y.o. female   Rt foot pain for almost 1 yr  MRI 06/2017: IMPRESSION: 1. Mild marrow edema and cortical thickening of the second metatarsal shaft, consistent with stress injury. No fracture. 2. 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   The majority of our discussion today was an overview of vascular malformations, the low-flow/high-flow categories, natural history, and treatment options.  I suspect that if her lesion is a vascular malformation, it would be, in the ISSVA classification, a venous malformation or mixed venous/lymphatic malformation.  Given her prior surgery at this site, this could be post operative or a recurrence of a non-vascular lesion.  We do not have pathology of her prior resection.   Given her significant symptoms, she is motivated to get treated, if possible. I believe, then, it is indicated to plan for a venogram to study this lesion.  This may include an IV in the foot for reflux to the deep system, and/or direct puncture of the lesion for images.  I do not think that we will need to do an angiogram, as I am not suspecting any high-flow component if it is a vascular lesion  Now scheduled for right leg venogram    Past Medical History:  Diagnosis Date  . Hypertension     Past Surgical History:  Procedure Laterality Date  . CARPAL TUNNEL RELEASE    . IR RADIOLOGIST EVAL & MGMT  12/14/2017  . TUBAL LIGATION       Allergies: Patient has no known allergies.  Medications: Prior to Admission medications   Medication Sig Start Date End Date Taking? Authorizing Provider  acetaminophen (TYLENOL) 500 MG tablet Take 500 mg by mouth every 8 (eight) hours as needed for moderate pain.   Yes [provider]  citalopram (CELEXA) 40 MG tablet Take 40 mg by mouth daily as needed (hot flashes).   Yes [provider]  losartan-hydrochlorothiazide (HYZAAR) 100-25 MG tablet Take 1 tablet by mouth daily.    Yes [provider]  potassium chloride SA (K-DUR,KLOR-CON) 20 MEQ tablet Take 20 mEq by mouth daily.    Yes [provider]  Wheat Dextrin (BENEFIBER) POWD Take 1 Scoop by mouth every other day.   Yes [provider]     Family History  Problem Relation Age of Onset  . Diabetes Mother   . Heart attack Mother   . Hypertension Father     Social History   Socioeconomic History  . Marital status: Married    Spouse name: Not on file  . Number of children: Not on file  . Years of education: Not on file  . Highest education level: Not on file  Occupational History  . Not on file  Social Needs  . Financial resource strain: Not on file  . Food insecurity:    Worry: Not on file    Inability: Not on file  . Transportation needs:    Medical: Not on file  Non-medical: Not on file  Tobacco Use  . Smoking status: Current Every Day Smoker  . Smokeless tobacco: Never Used  . Tobacco comment: 2 cigarettes/day  Substance and Sexual Activity  . Alcohol use: Yes    Comment: socially  . Drug use: No  . Sexual activity: Not on file  Lifestyle  . Physical activity:    Days per week: Not on file    Minutes per session: Not on file  . Stress: Not on file  Relationships  . Social connections:    Talks on phone: Not on file    Gets together: Not on file    Attends religious service: Not on file    Active member of club or organization: Not on file    Attends  meetings of clubs or organizations: Not on file    Relationship status: Not on file  Other Topics Concern  . Not on file  Social History Narrative  . Not on file    Review of Systems: A 12 point ROS discussed and pertinent positives are indicated in the HPI above.  All other systems are negative.  Review of Systems  Constitutional: Negative for activity change, fatigue and fever.  Cardiovascular: Negative for chest pain.  Gastrointestinal: Negative for abdominal pain.  Musculoskeletal: Positive for gait problem.  Neurological: Negative for weakness.  Psychiatric/Behavioral: Negative for behavioral problems and confusion.    Vital Signs: BP (!) 145/94   Pulse 65   Temp 98 F (36.7 C)   Resp (!) 22   Ht 5\' 5"  (1.651 m)   Wt 223 lb (101.2 kg)   SpO2 99%   BMI 37.11 kg/m   Physical Exam  Cardiovascular: Normal rate, regular rhythm and normal heart sounds.  Pulmonary/Chest: Effort normal and breath sounds normal.  Abdominal: Soft. Bowel sounds are normal.  Musculoskeletal: Normal range of motion.  Right foot swelling and pain  Skin: Skin is warm and dry.  Psychiatric: She has a normal mood and affect. Her behavior is normal. Judgment and thought content normal.  Vitals reviewed.   Imaging: Ir Radiologist Eval & Mgmt  Result Date: 12/14/2017 Please refer to notes tab for details about interventional procedure. (Op Note)   Labs:  CBC: No results for input(s): WBC, HGB, HCT, PLT in the last 8760 hours.  COAGS: No results for input(s): INR, APTT in the last 8760 hours.  BMP: No results for input(s): NA, K, CL, CO2, GLUCOSE, BUN, CALCIUM, CREATININE, GFRNONAA, GFRAA in the last 8760 hours.  Invalid input(s): CMP  LIVER FUNCTION TESTS: No results for input(s): BILITOT, AST, ALT, ALKPHOS, PROT, ALBUMIN in the last 8760 hours.  TUMOR MARKERS: No results for input(s): AFPTM, CEA, CA199, CHROMGRNA in the last 8760 hours.  Assessment and Plan:  Right foot  swelling/pain x 1 yr MRI with findings of possible venous malformation Scheduled today for venogram  Pt is aware of procedure benefits and risks including but not limited to Infection; bleeding; vessel damage; dye allergy Agreeable to proceed Consent signed andin chart  Thank you for this interesting consult.  I greatly enjoyed meeting Braelin Naeem and look forward to participating in their care.  A copy of this report was sent to the requesting provider on this date.  Electronically Signed: Lavonia Drafts, PA-C 01/12/2018, 8:40 AM   I spent a total of  30 Minutes   in face to face in clinical consultation, greater than 50% of which was counseling/coordinating care for right leg venogram

## 2018-01-18 ENCOUNTER — Other Ambulatory Visit: Payer: Self-pay | Admitting: Interventional Radiology

## 2018-01-18 DIAGNOSIS — M79661 Pain in right lower leg: Secondary | ICD-10-CM

## 2018-01-18 DIAGNOSIS — M7989 Other specified soft tissue disorders: Principal | ICD-10-CM

## 2018-02-06 ENCOUNTER — Ambulatory Visit
Admission: RE | Admit: 2018-02-06 | Discharge: 2018-02-06 | Disposition: A | Discharge status: Home / Self Care | Payer: BLUE CROSS/BLUE SHIELD | Source: Ambulatory Visit | Attending: Interventional Radiology | Admitting: Interventional Radiology

## 2018-02-06 DIAGNOSIS — M7989 Other specified soft tissue disorders: Principal | ICD-10-CM

## 2018-02-06 DIAGNOSIS — M8440XA Pathological fracture, unspecified site, initial encounter for fracture: Secondary | ICD-10-CM | POA: Insufficient documentation

## 2018-02-06 DIAGNOSIS — M654 Radial styloid tenosynovitis [de Quervain]: Secondary | ICD-10-CM | POA: Insufficient documentation

## 2018-02-06 DIAGNOSIS — M25579 Pain in unspecified ankle and joints of unspecified foot: Secondary | ICD-10-CM | POA: Insufficient documentation

## 2018-02-06 DIAGNOSIS — G5600 Carpal tunnel syndrome, unspecified upper limb: Secondary | ICD-10-CM | POA: Insufficient documentation

## 2018-02-06 DIAGNOSIS — M79661 Pain in right lower leg: Secondary | ICD-10-CM

## 2018-02-06 HISTORY — PX: IR RADIOLOGIST EVAL & MGMT: IMG5224

## 2018-02-06 NOTE — Progress Notes (Signed)
Chief Complaint: Low Flow Vascular Malformation, right foot  Referring Physician(s): Dr. Samara Deist  History of Present Illness: Jamie Estrada is a 52 y.o. female presenting as a scheduled follow up appointment to her recent right lower extremity venogram.    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  We confirmed the venous etiology of the malformation by direct puncture and venography performed 01/12/2018.    She returns today with no new complaints, to review her imaging and options. She continues to have significant pain at her right foot, typically worst at the end of the day, as she stands approximately 10-12 hours per day at work.  Compression therapy, which she has been using, has had minimal relief.    She has no new symptoms.     Past Medical History:  Diagnosis Date  . Hypertension     Past Surgical History:  Procedure Laterality Date  . CARPAL TUNNEL RELEASE    . IR RADIOLOGIST EVAL & MGMT  12/14/2017  . IR RADIOLOGIST EVAL & MGMT  02/06/2018  . IR US GUIDE VASC ACCESS RIGHT  01/12/2018  . IR VENO/EXT/UNI RIGHT  01/12/2018  . TUBAL LIGATION      Allergies: Patient has no known allergies.  Medications: Prior to Admission medications   Medication Sig Start Date End Date Taking? Authorizing Provider  acetaminophen (TYLENOL) 500 MG tablet Take 500 mg by mouth every 8 (eight) hours as needed for moderate pain.   Yes [provider]  citalopram (CELEXA) 40 MG tablet Take 40 mg by mouth daily as needed (hot flashes).   Yes [provider]  losartan-hydrochlorothiazide (HYZAAR) 100-25 MG tablet Take 1 tablet by mouth daily.    Yes [provider]  naproxen sodium (ALEVE) 220 MG tablet Take 220 mg by mouth.   Yes [provider]  potassium chloride SA (K-DUR,KLOR-CON) 20 MEQ tablet Take 20 mEq by mouth daily.    Yes [provider]  Wheat Dextrin  (BENEFIBER) POWD Take 1 Scoop by mouth every other day.   Yes [provider]     Family History  Problem Relation Age of Onset  . Diabetes Mother   . Heart attack Mother   . Hypertension Father     Social History   Socioeconomic History  . Marital status: Married    Spouse name: Not on file  . Number of children: Not on file  . Years of education: Not on file  . Highest education level: Not on file  Occupational History  . Not on file  Social Needs  . Financial resource strain: Not on file  . Food insecurity:    Worry: Not on file    Inability: Not on file  . Transportation needs:    Medical: Not on file    Non-medical: Not on file  Tobacco Use  . Smoking status: Current Every Day Smoker  . Smokeless tobacco: Never Used  . Tobacco comment: 2 cigarettes/day  Substance and Sexual Activity  . Alcohol use: Yes    Comment: socially  . Drug use: No  . Sexual activity: Not on file  Lifestyle  . Physical activity:    Days per week: Not on file    Minutes per session: Not on file  . Stress: Not on file  Relationships  . Social connections:    Talks on phone: Not on file    Gets together: Not on  file    Attends religious service: Not on file    Active member of club or organization: Not on file    Attends meetings of clubs or organizations: Not on file    Relationship status: Not on file  Other Topics Concern  . Not on file  Social History Narrative  . Not on file      Review of Systems: A 12 point ROS discussed and pertinent positives are indicated in the HPI above.  All other systems are negative.  Review of Systems  Vital Signs: BP (!) 143/92 (BP Location: Right Arm, Patient Position: Sitting, Cuff Size: Normal)   Pulse 74   Temp 98.4 F (36.9 C)   Resp 14   SpO2 100%   Physical Exam Targeted exam shows no evidence of infection. No new swelling.   Imaging: Ir Veno/ext/uni Right  Result Date: 01/12/2018 INDICATION: 52 year old female with a  history of right foot slow flow venous malformation. She presents today for imaging for potential future treatment. EXAM: ULTRASOUND-GUIDED VENA PUNCTURE OF RIGHT FOOT VEIN IMAGING GUIDED DIRECT PUNCTURE OF VENOUS MALFORMATION OF THE RIGHT FOREFOOT. MEDICATIONS: None ANESTHESIA/SEDATION: Moderate (conscious) sedation was employed during this procedure. A total of Versed 3.0 mg and Fentanyl 100 mcg was administered intravenously. Moderate Sedation Time: 48 minutes. The patient's level of consciousness and vital signs were monitored continuously by radiology nursing throughout the procedure under my direct supervision. FLUOROSCOPY TIME:  Fluoroscopy Time: 2 minutes 42 seconds (1.0 mGy). COMPLICATIONS: None PROCEDURE: Informed written consent was obtained from the patient after a thorough discussion of the procedural risks, benefits and alternatives. All questions were addressed. Maximal Sterile Barrier Technique was utilized including caps, mask, sterile gowns, sterile gloves, sterile drape, hand hygiene and skin antiseptic. A timeout was performed prior to the initiation of the procedure. Patient is positioned supine position on the fluoroscopy table. Scout images of the foot were performed with ultrasound and x-ray. Ultrasound survey of the right foot was performed with images stored and sent to PACs. Ultrasound guidance was then used to puncture superficial vein adjacent to the dorsalis pedis. 24 gauge Angiocath was inserted. This was taped to the skin. Initial images were then performed through this venous puncture without tourniquet applied and then with a tourniquet applied at the ankle. There was no opacification of the malformation with this initial puncture site. Ultrasound survey was performed. A standard length 25 gauge needle was then used to percutaneously puncture the malformation at the interspace of the first and second digit, adjacent to the bony changes of the second metatarsal. Needle was inserted  on the dorsal aspect of the foot with the needle tip position at the plantar aspect of the bones. Images were recorded with no tourniquet applied and then with compression on the venous outflow. All needles were removed, IV was removed, and sterile bandages were applied. Patient tolerated the procedure well and remained hemodynamically stable throughout. No complications were encountered and no significant blood loss. IMPRESSION: Status post image guided venogram of right foot venous malformation with ultrasound-guided IV placement and direct puncture. Signed, Dulcy Fanny. Dellia Nims, RPVI [facility] PLAN: We will be seeing the patient in follow-up to discuss imaging results and options for treatment. Electronically Signed   By: Corrie Mckusick D.O.   On: 01/12/2018 11:48   Ir US Guide Vasc Access Right  Result Date: 01/12/2018 INDICATION: 52 year old female with a history of right foot slow flow venous malformation. She presents today for  imaging for potential future treatment. EXAM: ULTRASOUND-GUIDED VENA PUNCTURE OF RIGHT FOOT VEIN IMAGING GUIDED DIRECT PUNCTURE OF VENOUS MALFORMATION OF THE RIGHT FOREFOOT. MEDICATIONS: None ANESTHESIA/SEDATION: Moderate (conscious) sedation was employed during this procedure. A total of Versed 3.0 mg and Fentanyl 100 mcg was administered intravenously. Moderate Sedation Time: 48 minutes. The patient's level of consciousness and vital signs were monitored continuously by radiology nursing throughout the procedure under my direct supervision. FLUOROSCOPY TIME:  Fluoroscopy Time: 2 minutes 42 seconds (1.0 mGy). COMPLICATIONS: None PROCEDURE: Informed written consent was obtained from the patient after a thorough discussion of the procedural risks, benefits and alternatives. All questions were addressed. Maximal Sterile Barrier Technique was utilized including caps, mask, sterile gowns, sterile gloves, sterile drape, hand  hygiene and skin antiseptic. A timeout was performed prior to the initiation of the procedure. Patient is positioned supine position on the fluoroscopy table. Scout images of the foot were performed with ultrasound and x-ray. Ultrasound survey of the right foot was performed with images stored and sent to PACs. Ultrasound guidance was then used to puncture superficial vein adjacent to the dorsalis pedis. 24 gauge Angiocath was inserted. This was taped to the skin. Initial images were then performed through this venous puncture without tourniquet applied and then with a tourniquet applied at the ankle. There was no opacification of the malformation with this initial puncture site. Ultrasound survey was performed. A standard length 25 gauge needle was then used to percutaneously puncture the malformation at the interspace of the first and second digit, adjacent to the bony changes of the second metatarsal. Needle was inserted on the dorsal aspect of the foot with the needle tip position at the plantar aspect of the bones. Images were recorded with no tourniquet applied and then with compression on the venous outflow. All needles were removed, IV was removed, and sterile bandages were applied. Patient tolerated the procedure well and remained hemodynamically stable throughout. No complications were encountered and no significant blood loss. IMPRESSION: Status post image guided venogram of right foot venous malformation with ultrasound-guided IV placement and direct puncture. Signed, Dulcy Fanny. Dellia Nims, RPVI [facility] PLAN: We will be seeing the patient in follow-up to discuss imaging results and options for treatment. Electronically Signed   By: Corrie Mckusick D.O.   On: 01/12/2018 11:48   Ir Radiologist Eval & Mgmt  Result Date: 02/06/2018 Please refer to notes tab for details about interventional procedure. (Op Note)   Labs:  CBC: Recent Labs     01/12/18 0730  WBC 4.6  HGB 13.0  HCT 41.4  PLT 301    COAGS: Recent Labs    01/12/18 0730  INR 1.17  APTT 28    BMP: Recent Labs    01/12/18 0730  NA 141  K 3.6  CL 108  CO2 26  GLUCOSE 92  BUN 13  CALCIUM 9.0  CREATININE 0.72  GFRNONAA >60  GFRAA >60    LIVER FUNCTION TESTS: No results for input(s): BILITOT, AST, ALT, ALKPHOS, PROT, ALBUMIN in the last 8760 hours.  TUMOR MARKERS: No results for input(s): AFPTM, CEA, CA199, CHROMGRNA in the last 8760 hours.  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   Today we reviewed her imaging, and treatment options, which I see as image-guided puncture and sclerotherapy.  Specific risks for sclerotherapy include, bleeding, infection, reaction to  the sclerosant, tissue injury, local thrombus/thrombophlebitis, DVT/thromboembolism, compartment syndrome, need for further procedure/surgery, need for retreatment, ongoing symptoms, cardiopulmonary collapse, death.   I did specifically discuss with her the fact that although she stands to benefit from treatment, it is difficult to anticipate the completeness of pain resolution, and she may need further therapy.  After our discussion regarding goals and the potential risks, she would like to proceed, as she is motivated to have her foot feel better during her work days.  Currently it is very debilitating.   Plan: - Plan for right lower extremity venogram, with sclerotherapy treatment (sotradecol) of right foot venous malformation.  [place] procedure we anticipate a short course of high dose anti-inflammatory medication, vicodin for break through.  - I have advised her to continue compression therapy - I have advised her to observe her other doctors appointments    Electronically Signed: Corrie Mckusick 02/06/2018, 11:36 AM   I spent a total of    25  Minutes in face to face in clinical consultation, greater than 50% of which was counseling/coordinating care for right foot venous malformation/slow flow malformation, symptomatic, with possible image guided sclerotherapy.

## 2018-02-12 ENCOUNTER — Other Ambulatory Visit: Payer: Self-pay | Admitting: Interventional Radiology

## 2018-02-20 ENCOUNTER — Other Ambulatory Visit (HOSPITAL_COMMUNITY): Payer: Self-pay | Admitting: Interventional Radiology

## 2018-02-20 ENCOUNTER — Telehealth (HOSPITAL_COMMUNITY): Payer: Self-pay

## 2018-02-20 DIAGNOSIS — M79661 Pain in right lower leg: Secondary | ICD-10-CM

## 2018-02-20 DIAGNOSIS — M79671 Pain in right foot: Secondary | ICD-10-CM

## 2018-02-20 DIAGNOSIS — M7989 Other specified soft tissue disorders: Principal | ICD-10-CM

## 2018-02-20 NOTE — Telephone Encounter (Signed)
Called to schedule sclerotherapy treatment, no answer, left vm. AW

## 2018-03-19 ENCOUNTER — Other Ambulatory Visit: Payer: Self-pay | Admitting: Radiology

## 2018-03-20 ENCOUNTER — Encounter (HOSPITAL_COMMUNITY): Payer: Self-pay

## 2018-03-20 ENCOUNTER — Other Ambulatory Visit: Payer: Self-pay

## 2018-03-20 ENCOUNTER — Ambulatory Visit (HOSPITAL_COMMUNITY)
Admission: RE | Admit: 2018-03-20 | Discharge: 2018-03-20 | Disposition: A | Discharge status: Home / Self Care | Payer: BLUE CROSS/BLUE SHIELD | Source: Ambulatory Visit | Attending: Interventional Radiology | Admitting: Interventional Radiology

## 2018-03-20 DIAGNOSIS — Z01812 Encounter for preprocedural laboratory examination: Secondary | ICD-10-CM | POA: Insufficient documentation

## 2018-03-20 DIAGNOSIS — M7989 Other specified soft tissue disorders: Secondary | ICD-10-CM | POA: Insufficient documentation

## 2018-03-20 DIAGNOSIS — M79661 Pain in right lower leg: Secondary | ICD-10-CM | POA: Diagnosis not present

## 2018-03-20 DIAGNOSIS — M79671 Pain in right foot: Secondary | ICD-10-CM | POA: Insufficient documentation

## 2018-03-20 DIAGNOSIS — Z5309 Procedure and treatment not carried out because of other contraindication: Secondary | ICD-10-CM | POA: Insufficient documentation

## 2018-03-20 LAB — CBC WITH DIFFERENTIAL/PLATELET
ABS IMMATURE GRANULOCYTES: 0.02 10*3/uL (ref 0.00–0.07)
Basophils Absolute: 0 10*3/uL (ref 0.0–0.1)
Basophils Relative: 1 %
Eosinophils Absolute: 0.1 10*3/uL (ref 0.0–0.5)
Eosinophils Relative: 2 %
HCT: 40.1 % (ref 36.0–46.0)
HEMOGLOBIN: 13 g/dL (ref 12.0–15.0)
IMMATURE GRANULOCYTES: 0 %
LYMPHS PCT: 45 %
Lymphs Abs: 2.2 10*3/uL (ref 0.7–4.0)
MCH: 28.6 pg (ref 26.0–34.0)
MCHC: 32.4 g/dL (ref 30.0–36.0)
MCV: 88.1 fL (ref 80.0–100.0)
MONO ABS: 0.5 10*3/uL (ref 0.1–1.0)
Monocytes Relative: 10 %
NEUTROS ABS: 2 10*3/uL (ref 1.7–7.7)
NEUTROS PCT: 42 %
PLATELETS: 263 10*3/uL (ref 150–400)
RBC: 4.55 MIL/uL (ref 3.87–5.11)
RDW: 14.4 % (ref 11.5–15.5)
WBC: 4.8 10*3/uL (ref 4.0–10.5)
nRBC: 0 % (ref 0.0–0.2)

## 2018-03-20 LAB — PROTIME-INR
INR: 1.08
PROTHROMBIN TIME: 13.9 s (ref 11.4–15.2)

## 2018-03-20 LAB — BASIC METABOLIC PANEL
ANION GAP: 8 (ref 5–15)
BUN: 15 mg/dL (ref 6–20)
CHLORIDE: 108 mmol/L (ref 98–111)
CO2: 26 mmol/L (ref 22–32)
Calcium: 9.1 mg/dL (ref 8.9–10.3)
Creatinine, Ser: 0.72 mg/dL (ref 0.44–1.00)
GFR calc Af Amer: >60 mL/min (ref >60)
GFR calc non Af Amer: >60 mL/min (ref >60)
GLUCOSE: 94 mg/dL (ref 70–99)
Potassium: 4.3 mmol/L (ref 3.5–5.1)
Sodium: 142 mmol/L (ref 135–145)

## 2018-03-20 MED ORDER — SODIUM CHLORIDE 0.9 % IV SOLN: INTRAVENOUS | Status: DC

## 2018-03-20 NOTE — Discharge Instructions (Addendum)
Sclerotherapy, Care After  This sheet gives you information about how to care for yourself after your procedure. Your health care provider may also give you more specific instructions. If you have problems or questions, contact your health care provider.  What can I expect after the procedure?  After the procedure, it is common to have:  Swelling.  Bruising.  Soreness.  Mild skin discoloration.  Slight bleeding from an injection site.  Follow these instructions at home:  Injection area care  Follow instructions from your health care provider about how to take care of your injection area. Make sure you:  Wash your hands with soap and water before you change your bandage (dressing). If soap and water are not available, use hand sanitizer.  Change your dressing as told by your health care provider.  Check your injection area every day for signs of infection. Check for:  Redness, swelling, or pain.  Fluid or blood.  Warmth.  Pus or a bad smell.  Activity  Do light exercise every day, as told by your health care provider. Walking or riding a stationary bike may be good options for you.  Return to your normal activities as told by your health care provider. Ask your health care provider what activities are safe for you.  Do not drive until your health care provider approves. Ask your health care provider when is safe for you to drive. You may need to wait 1-2 days before driving.  General instructions    Take over-the-counter and prescription medicines only as told by your health care provider.  Do not use lotions or creams on your legs unless your health care provider approves.  Do not use any products that contain nicotine or tobacco, such as cigarettes and e-cigarettes. If you need help quitting, ask your health care provider.  Do not take baths or showers, swim, or use a hot tub until your health care provider approves. You may need to take sponge baths until 1-2 days after the procedure.  Wear compression stockings  for a week, or as long as your health care provider recommends. These stockings help to prevent blood clots and reduce swelling in your legs.  Wear loose-fitting clothing on the treatment area.  Keep all follow-up visits as told by your health care provider. This is important.  Contact a health care provider if:  You have redness, swelling, or pain around an injection site.  You have fluid or blood coming from an injection site.  An injection area feels warm to the touch.  You have pus or a bad smell coming from an injection site.  You have a fever.  Get help right away if:  You faint.  You have severe pain.  You have leg pain that gets worse when you walk.  You have redness or swelling in your leg that is getting worse.  You have trouble breathing.  You cough up blood.  Summary  Swelling, bruising, and soreness are common after this procedure.  Check your injection area every day for signs of infection.  Make sure you wear your compression stockings as told by your health care provider. These stockings help to prevent blood clots and reduce swelling in your legs.  This information is not intended to replace advice given to you by your health care provider. Make sure you discuss any questions you have with your health care provider.  Document Released: 04/19/2016 Document Revised: 04/19/2016 Document Reviewed: 04/19/2016  Elsevier Interactive Patient Education  2019 [employer]   Inc[employer]      Moderate Conscious Sedation, Adult, Care After  These instructions provide you with information about caring for yourself after your procedure. Your health care provider may also give you more specific instructions. Your treatment has been planned according to current medical practices, but problems sometimes occur. Call your health care provider if you have any problems or questions after your procedure.  What can I expect after the procedure?  After your procedure, it is common:  To feel sleepy for several hours.  To feel clumsy and  have poor balance for several hours.  To have poor judgment for several hours.  To vomit if you eat too soon.  Follow these instructions at home:  For at least 24 hours after the procedure:    Do not:  Participate in activities where you could fall or become injured.  Drive.  Use heavy machinery.  Drink alcohol.  Take sleeping pills or medicines that cause drowsiness.  Make important decisions or sign legal documents.  Take care of children on your own.  Rest.  Eating and drinking  Follow the diet recommended by your health care provider.  If you vomit:  Drink water, juice, or soup when you can drink without vomiting.  Make sure you have little or no nausea before eating solid foods.  General instructions  Have a responsible adult stay with you until you are awake and alert.  Take over-the-counter and prescription medicines only as told by your health care provider.  If you smoke, do not smoke without supervision.  Keep all follow-up visits as told by your health care provider. This is important.  Contact a health care provider if:  You keep feeling nauseous or you keep vomiting.  You feel light-headed.  You develop a rash.  You have a fever.  Get help right away if:  You have trouble breathing.  This information is not intended to replace advice given to you by your health care provider. Make sure you discuss any questions you have with your health care provider.  Document Released: 12/19/2012 Document Revised: 08/03/2015 Document Reviewed: 06/20/2015  Elsevier Interactive Patient Education  2019 [employer] Inc[employer]

## 2018-03-20 NOTE — Progress Notes (Signed)
Patient presented today for RLE venogram with sclerotherapy - patient reports ingesting one cup of coffee with cream around 5 am today.   Due to full schedule today patient procedure will be rescheduled. Message has been sent to IR schedulers for assistance with this.  Please call IR with questions or concerns.  Candiss Norse, PA-C

## 2018-04-11 ENCOUNTER — Other Ambulatory Visit: Payer: Self-pay | Admitting: Radiology

## 2018-04-12 ENCOUNTER — Encounter (HOSPITAL_COMMUNITY): Payer: Self-pay

## 2018-04-12 ENCOUNTER — Other Ambulatory Visit (HOSPITAL_COMMUNITY): Payer: Self-pay | Admitting: Interventional Radiology

## 2018-04-12 ENCOUNTER — Other Ambulatory Visit (HOSPITAL_COMMUNITY): Payer: Self-pay | Admitting: Physician Assistant

## 2018-04-12 ENCOUNTER — Ambulatory Visit (HOSPITAL_COMMUNITY)
Admission: RE | Admit: 2018-04-12 | Discharge: 2018-04-12 | Disposition: A | Discharge status: Home / Self Care | Payer: BLUE CROSS/BLUE SHIELD | Source: Ambulatory Visit | Attending: Interventional Radiology | Admitting: Interventional Radiology

## 2018-04-12 DIAGNOSIS — I10 Essential (primary) hypertension: Secondary | ICD-10-CM | POA: Diagnosis not present

## 2018-04-12 DIAGNOSIS — Q279 Congenital malformation of peripheral vascular system, unspecified: Secondary | ICD-10-CM | POA: Insufficient documentation

## 2018-04-12 DIAGNOSIS — M79671 Pain in right foot: Secondary | ICD-10-CM

## 2018-04-12 DIAGNOSIS — M7989 Other specified soft tissue disorders: Secondary | ICD-10-CM | POA: Insufficient documentation

## 2018-04-12 DIAGNOSIS — F17210 Nicotine dependence, cigarettes, uncomplicated: Secondary | ICD-10-CM | POA: Diagnosis not present

## 2018-04-12 DIAGNOSIS — M79661 Pain in right lower leg: Secondary | ICD-10-CM

## 2018-04-12 DIAGNOSIS — Z8249 Family history of ischemic heart disease and other diseases of the circulatory system: Secondary | ICD-10-CM | POA: Diagnosis not present

## 2018-04-12 DIAGNOSIS — Z79899 Other long term (current) drug therapy: Secondary | ICD-10-CM | POA: Insufficient documentation

## 2018-04-12 HISTORY — PX: IR US GUIDE VASC ACCESS RIGHT: IMG2390

## 2018-04-12 HISTORY — PX: IR VENO/EXT/UNI RIGHT: IMG676

## 2018-04-12 LAB — PROTIME-INR
INR: 1.05
Prothrombin Time: 13.6 seconds (ref 11.4–15.2)

## 2018-04-12 LAB — CBC
HCT: 41.7 % (ref 36.0–46.0)
Hemoglobin: 13.4 g/dL (ref 12.0–15.0)
MCH: 27.9 pg (ref 26.0–34.0)
MCHC: 32.1 g/dL (ref 30.0–36.0)
MCV: 86.7 fL (ref 80.0–100.0)
Platelets: 279 10*3/uL (ref 150–400)
RBC: 4.81 MIL/uL (ref 3.87–5.11)
RDW: 14.4 % (ref 11.5–15.5)
WBC: 5.4 10*3/uL (ref 4.0–10.5)
nRBC: 0 % (ref 0.0–0.2)

## 2018-04-12 LAB — BASIC METABOLIC PANEL
ANION GAP: 8 (ref 5–15)
BUN: 19 mg/dL (ref 6–20)
CO2: 23 mmol/L (ref 22–32)
Calcium: 8.6 mg/dL — ABNORMAL LOW (ref 8.9–10.3)
Chloride: 108 mmol/L (ref 98–111)
Creatinine, Ser: 0.74 mg/dL (ref 0.44–1.00)
GFR calc Af Amer: >60 mL/min (ref >60)
GFR calc non Af Amer: >60 mL/min (ref >60)
Glucose, Bld: 96 mg/dL (ref 70–99)
Potassium: 3.9 mmol/L (ref 3.5–5.1)
Sodium: 139 mmol/L (ref 135–145)

## 2018-04-12 MED ORDER — SODIUM CHLORIDE 0.9 % IV SOLN
INTRAVENOUS | Status: DC
  Administered 2018-04-12: 10:00:00 via INTRAVENOUS

## 2018-04-12 MED ORDER — FENTANYL CITRATE (PF) 100 MCG/2ML IJ SOLN
INTRAMUSCULAR | Status: AC
  Filled 2018-04-12: qty 4

## 2018-04-12 MED ORDER — MIDAZOLAM HCL 2 MG/2ML IJ SOLN
INTRAMUSCULAR | Status: AC | PRN
  Administered 2018-04-12: 1 mg via INTRAVENOUS
  Administered 2018-04-12 (×2): 0.5 mg via INTRAVENOUS

## 2018-04-12 MED ORDER — HYDROCODONE-ACETAMINOPHEN 5-325 MG PO TABS: 1.0000 | ORAL_TABLET | Freq: Four times a day (QID) | ORAL | 0 refills | Status: AC | PRN

## 2018-04-12 MED ORDER — LIDOCAINE HCL 1 % IJ SOLN
INTRAMUSCULAR | Status: AC
  Filled 2018-04-12: qty 20

## 2018-04-12 MED ORDER — LIDOCAINE HCL 1 % IJ SOLN
INTRAMUSCULAR | Status: AC | PRN
  Administered 2018-04-12: 10 mL

## 2018-04-12 MED ORDER — FENTANYL CITRATE (PF) 100 MCG/2ML IJ SOLN
INTRAMUSCULAR | Status: AC | PRN
  Administered 2018-04-12: 25 ug via INTRAVENOUS
  Administered 2018-04-12: 50 ug via INTRAVENOUS
  Administered 2018-04-12: 25 ug via INTRAVENOUS
  Administered 2018-04-12: 50 ug via INTRAVENOUS

## 2018-04-12 MED ORDER — METHYLPREDNISOLONE SODIUM SUCC 125 MG IJ SOLR
125.0000 mg | Freq: Once | INTRAMUSCULAR | Status: AC
  Administered 2018-04-12: 125 mg via INTRAVENOUS
  Filled 2018-04-12: qty 2

## 2018-04-12 MED ORDER — MIDAZOLAM HCL 2 MG/2ML IJ SOLN
1.0000 mg | Freq: Once | INTRAMUSCULAR | Status: AC
  Administered 2018-04-12: 1 mg via INTRAVENOUS

## 2018-04-12 MED ORDER — SODIUM TETRADECYL SULFATE 1 % IV SOLN
INTRAVENOUS | Status: AC | PRN
  Administered 2018-04-12: 2 mL via INTRAVENOUS

## 2018-04-12 MED ORDER — IOPAMIDOL (ISOVUE-300) INJECTION 61%
INTRAVENOUS | Status: AC
  Administered 2018-04-12: 5 mL
  Filled 2018-04-12: qty 50

## 2018-04-12 MED ORDER — MIDAZOLAM HCL 2 MG/2ML IJ SOLN
INTRAMUSCULAR | Status: AC
  Administered 2018-04-12: 1 mg via INTRAVENOUS
  Filled 2018-04-12: qty 4

## 2018-04-12 MED ORDER — PREDNISONE 10 MG (21) PO TBPK: ORAL_TABLET | ORAL | 0 refills | Status: AC

## 2018-04-12 NOTE — H&P (Signed)
Chief Complaint: Foot swelling  Referring Physician(s): Corrie Mckusick  Supervising Physician: Corrie Mckusick  Patient Status: [facility] - Out-pt  History of Present Illness: Jamie Estrada is a 53 y.o. 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   She had a venogram by Dr. Earleen Newport on 01/12/2018 which confirmed venous etiology.  She continues to have significant pain at her right foot, typically worst at the end of the day, as she stands approximately 10-12 hours per day at work.  Compression therapy, which she has been using, has had minimal relief.    She is here today for venogram with sclerotherapy.  She is NPO. She feels well today. No nausea/vomiting. No Fever/chills. ROS negative.   Past Medical History:  Diagnosis Date  . Hypertension     Past Surgical History:  Procedure Laterality Date  . CARPAL TUNNEL RELEASE    . IR RADIOLOGIST EVAL & MGMT  12/14/2017  . IR RADIOLOGIST EVAL & MGMT  02/06/2018  . IR US GUIDE VASC ACCESS RIGHT  01/12/2018  . IR VENO/EXT/UNI RIGHT  01/12/2018  . TUBAL LIGATION      Allergies: Patient has no known allergies.  Medications: Prior to Admission medications   Medication Sig Start Date End Date Taking? Authorizing Provider  acetaminophen (TYLENOL) 500 MG tablet Take 500 mg by mouth every 8 (eight) hours as needed for moderate pain.   Yes [provider]  citalopram (CELEXA) 10 MG tablet Take 10 mg by mouth daily.    Yes [provider]  losartan-hydrochlorothiazide (HYZAAR) 100-25 MG tablet Take 1 tablet by mouth daily.    Yes [provider]  naproxen sodium (ALEVE) 220 MG tablet Take 220-440 mg by mouth daily as needed.    Yes [provider]  potassium chloride SA (K-DUR,KLOR-CON) 20 MEQ tablet Take 20 mEq by mouth daily.    Yes [provider]  Wheat Dextrin (BENEFIBER) POWD Take 1 Scoop by mouth every other day.   Yes [provider]     Family History  Problem Relation Age of Onset  . Diabetes Mother   . Heart attack Mother   . Hypertension Father     Social History   Socioeconomic History  . Marital status: Married    Spouse name: Not on file  . Number of children: Not on file  . Years of education: Not on file  . Highest education level: Not on file  Occupational History  . Not on file  Social Needs  . Financial resource strain: Not on file  . Food insecurity:    Worry: Not on file    Inability: Not on file  . Transportation needs:    Medical: Not on file    Non-medical: Not on file  Tobacco Use  . Smoking status: Current Every Day Smoker    Types: Cigarettes  . Smokeless tobacco: Never Used  . Tobacco comment: 2 cigarettes/day  Substance and Sexual Activity  . Alcohol use: Yes    Comment: socially  . Drug use: No  . Sexual activity: Not on file  Lifestyle  . Physical activity:    Days per week: Not on file    Minutes per session: Not on file  . Stress: Not on file  Relationships  . Social connections:    Talks on phone: Not on file    Gets together: Not on file    Attends religious service: Not on file  Active member of club or organization: Not on file    Attends meetings of clubs or organizations: Not on file    Relationship status: Not on file  Other Topics Concern  . Not on file  Social History Narrative  . Not on file     Review of Systems: A 12 point ROS discussed and pertinent positives are indicated in the HPI above.  All other systems are negative.  Review of Systems  Vital Signs: BP (!) 120/53   Pulse 74   Temp 97.7 F (36.5 C) (Oral)   Resp 16   Ht 5\' 5"  (1.651 m)   Wt 99.8 kg   SpO2 100%   BMI 36.61 kg/m   Physical Exam Vitals signs reviewed.  Constitutional:      Appearance: Normal appearance.  HENT:     Head: Normocephalic and atraumatic.  Eyes:     Extraocular Movements: Extraocular movements intact.  Neck:     Musculoskeletal:  Normal range of motion.  Cardiovascular:     Rate and Rhythm: Normal rate and regular rhythm.  Pulmonary:     Effort: Pulmonary effort is normal.     Breath sounds: Normal breath sounds.  Abdominal:     General: There is no distension.     Palpations: Abdomen is soft.     Tenderness: There is no abdominal tenderness.  Musculoskeletal: Normal range of motion.  Skin:    General: Skin is warm and dry.  Neurological:     General: No focal deficit present.     Mental Status: She is alert and oriented to person, place, and time.  Psychiatric:        Mood and Affect: Mood normal.        Behavior: Behavior normal.        Thought Content: Thought content normal.        Judgment: Judgment normal.     Imaging: No results found.  Labs:  CBC: Recent Labs    01/12/18 0730 03/20/18 0748 04/12/18 0655  WBC 4.6 4.8 5.4  HGB 13.0 13.0 13.4  HCT 41.4 40.1 41.7  PLT 301 263 279    COAGS: Recent Labs    01/12/18 0730 03/20/18 0748 04/12/18 0655  INR 1.17 1.08 1.05  APTT 28  --   --     BMP: Recent Labs    01/12/18 0730 03/20/18 0748  NA 141 142  K 3.6 4.3  CL 108 108  CO2 26 26  GLUCOSE 92 94  BUN 13 15  CALCIUM 9.0 9.1  CREATININE 0.72 0.72  GFRNONAA >60 >60  GFRAA >60 >60    LIVER FUNCTION TESTS: No results for input(s): BILITOT, AST, ALT, ALKPHOS, PROT, ALBUMIN in the last 8760 hours.  TUMOR MARKERS: No results for input(s): AFPTM, CEA, CA199, CHROMGRNA in the last 8760 hours.  Assessment and Plan:  Dr. Earleen Newport did specifically discuss with her the fact that although she stands to benefit from treatment, it is difficult to anticipate the completeness of pain resolution, and she may need further therapy.    Will proceed with right lower extremity venogram, with sclerotherapy treatment (sotradecol) of right foot venous malformation by Dr. Earleen Newport.  Specific risks for sclerotherapy include, bleeding, infection, reaction to the sclerosant, tissue injury, local  thrombus/thrombophlebitis, DVT/thromboembolism, compartment syndrome, need for further procedure/surgery, need for retreatment, ongoing symptoms, cardiopulmonary collapse, death.   Consent signed and in chart.  I will send in a Medrol Dose Pak and Vicodin for pain to her  pharmacy in [place].  Electronically Signed: Murrell Redden, PA-C   04/12/2018, 7:43 AM      I spent a total of  25 Minutes in face to face in clinical consultation, greater than 50% of which was counseling/coordinating care for venogram with sclerotherapy.

## 2018-04-12 NOTE — Discharge Instructions (Addendum)
Venogram, Care After  This sheet gives you information about how to care for yourself after your procedure. Your health care provider may also give you more specific instructions. If you have problems or questions, contact your health care provider.  What can I expect after the procedure?  After the procedure, it is common to have:  Bruising or mild discomfort in the area where the IV was inserted (insertion site).  Follow these instructions at home:  Eating and drinking    Follow instructions from your health care provider about eating or drinking restrictions.  Drink a lot of fluids for the first several days after the procedure, as directed by your health care provider. This helps to wash (flush) the contrast out of your body. Examples of healthy fluids include water or low-calorie drinks.  General instructions  Check your IV insertion area every day for signs of infection. Check for:  Redness, swelling, or pain.  Fluid or blood.  Warmth.  Pus or a bad smell.  Take over-the-counter and prescription medicines only as told by your health care provider.  Rest and return to your normal activities as told by your health care provider. Ask your health care provider what activities are safe for you.  Do not drive for 24 hours if you were given a medicine to help you relax (sedative), or until your health care provider approves.  Keep all follow-up visits as told by your health care provider. This is important.  Contact a health care provider if:  Your skin becomes itchy or you develop a rash or hives.  You have a fever that does not get better with medicine.  You feel nauseous.  You vomit.  You have redness, swelling, or pain around the insertion site.  You have fluid or blood coming from the insertion site.  Your insertion area feels warm to the touch.  You have pus or a bad smell coming from the insertion site.  Get help right away if:  You have difficulty breathing or shortness of breath.  You develop chest  pain.  You faint.  You feel very dizzy.  These symptoms may represent a serious problem that is an emergency. Do not wait to see if the symptoms will go away. Get medical help right away. Call your local emergency services (911 in the [place] Do not drive yourself to the hospital.  Summary  After your procedure, it is common to have bruising or mild discomfort in the area where the IV was inserted.  You should check your IV insertion area every day for signs of infection.  Take over-the-counter and prescription medicines only as told by your health care provider.  You should drink a lot of fluids for the first several days after the procedure to help flush the contrast from your body.  This information is not intended to replace advice given to you by your health care provider. Make sure you discuss any questions you have with your health care provider.  Document Released: 12/19/2012 Document Revised: 01/23/2016 Document Reviewed: 01/23/2016  Elsevier Interactive Patient Education  2019 Else[employer] Inc.      Moderate Conscious Sedation, Adult, Care After  These instructions provide you with information about caring for yourself after your procedure. Your health care provider may also give you more specific instructions. Your treatment has been planned according to current medical practices, but problems sometimes occur. Call your health care provider if you have any problems or questions after your procedure.  What can I   expect after the procedure?  After your procedure, it is common:  To feel sleepy for several hours.  To feel clumsy and have poor balance for several hours.  To have poor judgment for several hours.  To vomit if you eat too soon.  Follow these instructions at home:  For at least 24 hours after the procedure:    Do not:  Participate in activities where you could fall or become injured.  Drive.  Use heavy machinery.  Drink alcohol.  Take sleeping pills or medicines that cause drowsiness.  Make important  decisions or sign legal documents.  Take care of children on your own.  Rest.  Eating and drinking  Follow the diet recommended by your health care provider.  If you vomit:  Drink water, juice, or soup when you can drink without vomiting.  Make sure you have little or no nausea before eating solid foods.  General instructions  Have a responsible adult stay with you until you are awake and alert.  Take over-the-counter and prescription medicines only as told by your health care provider.  If you smoke, do not smoke without supervision.  Keep all follow-up visits as told by your health care provider. This is important.  Contact a health care provider if:  You keep feeling nauseous or you keep vomiting.  You feel light-headed.  You develop a rash.  You have a fever.  Get help right away if:  You have trouble breathing.  This information is not intended to replace advice given to you by your health care provider. Make sure you discuss any questions you have with your health care provider.  Document Released: 12/19/2012 Document Revised: 08/03/2015 Document Reviewed: 06/20/2015  Elsevier Interactive Patient Education  2019 [employer]vier Inc[employer]

## 2018-04-12 NOTE — Procedures (Signed)
Interventional Radiology Procedure Note  History: 53 yo female with right foot slow flow venous malformation, significantly symptomatic, intruding on her day to day activities.  Procedure:  Fluoro guided puncture of right foot venous malformation, with foam sclerotherapy performed, using compressive outflow control. ~2cc of sotradecol used in detergent foam formulation.    Complications: None   Recommendations:  - iv dose now of steroid - 1 hr dc home - follow up with Dr Earleen Newport in ~4 weeks in clinic - Do not submerge for 7 days - Routine wound care - medrol dose pack with breakthrough pain meds - patient is takin 2-3 weeks from work, for best chance of recovery.  - local compression therapy such as foot/ankle compression wrap   Signed,  Dulcy Fanny. Earleen Newport, DO

## 2018-04-17 ENCOUNTER — Other Ambulatory Visit (HOSPITAL_COMMUNITY): Payer: Self-pay | Admitting: Interventional Radiology

## 2018-04-17 DIAGNOSIS — M79661 Pain in right lower leg: Secondary | ICD-10-CM

## 2018-04-17 DIAGNOSIS — M7989 Other specified soft tissue disorders: Principal | ICD-10-CM

## 2018-04-17 DIAGNOSIS — M79671 Pain in right foot: Secondary | ICD-10-CM

## 2018-04-24 ENCOUNTER — Other Ambulatory Visit: Payer: Self-pay | Admitting: Interventional Radiology

## 2018-04-24 DIAGNOSIS — Q279 Congenital malformation of peripheral vascular system, unspecified: Secondary | ICD-10-CM

## 2018-05-09 ENCOUNTER — Ambulatory Visit
Admission: RE | Admit: 2018-05-09 | Discharge: 2018-05-09 | Disposition: A | Discharge status: Home / Self Care | Payer: BLUE CROSS/BLUE SHIELD | Source: Ambulatory Visit | Attending: Interventional Radiology | Admitting: Interventional Radiology

## 2018-05-09 ENCOUNTER — Encounter: Payer: Self-pay | Admitting: Radiology

## 2018-05-09 DIAGNOSIS — Q279 Congenital malformation of peripheral vascular system, unspecified: Secondary | ICD-10-CM

## 2018-05-09 HISTORY — PX: IR RADIOLOGIST EVAL & MGMT: IMG5224

## 2018-05-09 NOTE — Progress Notes (Signed)
Chief Complaint: The patient is seen in follow up today s/p venogram of right foot vascular malformation  History of present illness: Jamie Estrada is a 53 y.o. 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 She underwent venogram with Dr. Earleen Newport 01/12/2018 to confirm venous etiology followed by sclerotherapy 04/12/18 for treatment.  She presents to IR clinic today for follow-up of her procedure.  Per patient, after the procedure she had expected pain and difficulty walking which resolved. She reports significant improvement in her pain and now endorses mild discomfort in her right foot.  She states prior the procedure she was not able to complete usual activities without pain medication, however now is able to go about her day with only compression socks.  She has slowly been increasing her walking distance weekly. She has been elevating and icing her foot which has also helped with her symptoms.  She denies fever, chills, cramping, claudication, or gait abnormality. She feels ready to return to work.    Past Medical History:  Diagnosis Date  . Hypertension     Past Surgical History:  Procedure Laterality Date  . CARPAL TUNNEL RELEASE    . IR RADIOLOGIST EVAL & MGMT  12/14/2017  . IR RADIOLOGIST EVAL & MGMT  02/06/2018  . IR US GUIDE VASC ACCESS RIGHT  01/12/2018  . IR US GUIDE VASC ACCESS RIGHT  04/12/2018  . IR VENO/EXT/UNI RIGHT  01/12/2018  . IR VENO/EXT/UNI RIGHT  04/12/2018  . TUBAL LIGATION      Allergies: Patient has no known allergies.  Medications: Prior to Admission medications   Medication Sig Start Date End Date Taking? Authorizing Provider  acetaminophen (TYLENOL) 500 MG tablet Take 500 mg by mouth every 8 (eight) hours as needed for moderate pain.    [provider]  citalopram (CELEXA) 10 MG tablet Take 10 mg by mouth daily.     [provider]  HYDROcodone-acetaminophen (NORCO) 5-325 MG tablet Take 1 tablet by  mouth every 6 (six) hours as needed for moderate pain. 04/12/18   Ardis Rowan, PA-C  losartan-hydrochlorothiazide (HYZAAR) 100-25 MG tablet Take 1 tablet by mouth daily.     [provider]  naproxen sodium (ALEVE) 220 MG tablet Take 220-440 mg by mouth daily as needed.     [provider]  potassium chloride SA (K-DUR,KLOR-CON) 20 MEQ tablet Take 20 mEq by mouth daily.     [provider]  predniSONE (STERAPRED UNI-PAK 21 TAB) 10 MG (21) TBPK tablet Take as directed on pack until completed 04/12/18   Ardis Rowan, PA-C  Wheat Dextrin (BENEFIBER) POWD Take 1 Scoop by mouth every other day.    [provider]     Family History  Problem Relation Age of Onset  . Diabetes Mother   . Heart attack Mother   . Hypertension Father     Social History   Socioeconomic History  . Marital status: Married    Spouse name: Not on file  . Number of children: Not on file  . Years of education: Not on file  . Highest education level: Not on file  Occupational History  . Not on file  Social Needs  . Financial resource strain: Not on file  . Food insecurity:    Worry: Not on file    Inability: Not on file  . Transportation needs:    Medical: Not on file    Non-medical: Not on file  Tobacco Use  .  Smoking status: Current Every Day Smoker    Types: Cigarettes  . Smokeless tobacco: Never Used  . Tobacco comment: 2 cigarettes/day  Substance and Sexual Activity  . Alcohol use: Yes    Comment: socially  . Drug use: No  . Sexual activity: Not on file  Lifestyle  . Physical activity:    Days per week: Not on file    Minutes per session: Not on file  . Stress: Not on file  Relationships  . Social connections:    Talks on phone: Not on file    Gets together: Not on file    Attends religious service: Not on file    Active member of club or organization: Not on file    Attends meetings of clubs or organizations: Not on file    Relationship  status: Not on file  Other Topics Concern  . Not on file  Social History Narrative  . Not on file     Vital Signs: There were no vitals taken for this visit.  Physical Exam  NAD, sitting comfortably in char.  PV: Distal pulses intact bilaterally, DP 2+ bilaterally MSK: ROM normal, no swelling or tenderness. Skin: warm, dry, groin non-tender and without evidence of pseudoaneurysm  Imaging: No results found.  Labs:  CBC: Recent Labs    01/12/18 0730 03/20/18 0748 04/12/18 0655  WBC 4.6 4.8 5.4  HGB 13.0 13.0 13.4  HCT 41.4 40.1 41.7  PLT 301 263 279    COAGS: Recent Labs    01/12/18 0730 03/20/18 0748 04/12/18 0655  INR 1.17 1.08 1.05  APTT 28  --   --     BMP: Recent Labs    01/12/18 0730 03/20/18 0748 04/12/18 0655  NA 141 142 139  K 3.6 4.3 3.9  CL 108 108 108  CO2 26 26 23   GLUCOSE 92 94 96  BUN 13 15 19   CALCIUM 9.0 9.1 8.6*  CREATININE 0.72 0.72 0.74  GFRNONAA >60 >60 >60  GFRAA >60 >60 >60    LIVER FUNCTION TESTS: No results for input(s): BILITOT, AST, ALT, ALKPHOS, PROT, ALBUMIN in the last 8760 hours.  Assessment: Symptomatic slow flow vascular malformation of the right foot s/p sclerotherapy 04/12/18 Jamie Estrada returns to IR clinic today after undergoing sclerotherapy of her right foot for venous malformation 04/12/18.  She reports improvement in her right foot pain.  She has recovered well from her procedure, increasing her activity weekly.  Her physical exam is benign.  She can return to work.  Follow-up plan per Dr. Earleen Newport.   Signed: Docia Barrier, PA 05/09/2018, 9:34 AM   Please refer to Dr. Earleen Newport attestation of this note for management and plan.

## 2018-05-23 ENCOUNTER — Encounter: Payer: Self-pay | Admitting: Radiology

## 2018-05-23 ENCOUNTER — Other Ambulatory Visit: Payer: Self-pay | Admitting: Interventional Radiology

## 2018-05-23 DIAGNOSIS — Q279 Congenital malformation of peripheral vascular system, unspecified: Secondary | ICD-10-CM

## 2018-05-30 ENCOUNTER — Encounter: Payer: Self-pay | Admitting: Radiology

## 2018-06-07 ENCOUNTER — Other Ambulatory Visit: Payer: BLUE CROSS/BLUE SHIELD

## 2018-10-11 ENCOUNTER — Encounter: Payer: Self-pay | Admitting: *Deleted

## 2018-10-11 ENCOUNTER — Ambulatory Visit
Admission: RE | Admit: 2018-10-11 | Discharge: 2018-10-11 | Disposition: A | Discharge status: Home / Self Care | Payer: BLUE CROSS/BLUE SHIELD | Source: Ambulatory Visit | Attending: Interventional Radiology | Admitting: Interventional Radiology

## 2018-10-11 DIAGNOSIS — Q279 Congenital malformation of peripheral vascular system, unspecified: Secondary | ICD-10-CM

## 2018-10-11 HISTORY — PX: IR RADIOLOGIST EVAL & MGMT: IMG5224

## 2018-10-11 NOTE — Progress Notes (Signed)
Chief Complaint: Right foot venous malformation, pain  Referring Physician(s): Samara Deist  History of Present Illness: Jamie Estrada is a 53 y.o. female presenting as a scheduled follow up to [place] [facility] today, SP treatment for a symptomatic right foot venous malformation.   Jamie Estrada is here today by herself for the visit.   She was first seen by our service 12/14/2017 with symptoms of right foot pain and swelling.  At its worst she reported at that time an intensity of "12/10".     She was treated 04/12/2018, with direct percutaneous venous puncture, venogram, and sclerotherapy (sotradecol foam).  She was discharged home the same day.   She reported that although she had some initial soreness and pain the precluded the ability to be standing for long hours, this has significantly improved.  She required some extra time out of work with a slow return to full duties.  At this point, she has returned to work full time, and is comfortable.   She wears a sleeve on her ankle for support, good shoes, and a lower leg compression stocking.  This seems to help her.    She does report some discomfort of both feet towards the end of a work day, as she stands the majority of the day.    At this point she seems satisfied, as her symptoms are much better than last October.   Past Medical History:  Diagnosis Date  . Hypertension     Past Surgical History:  Procedure Laterality Date  . CARPAL TUNNEL RELEASE    . IR RADIOLOGIST EVAL & MGMT  12/14/2017  . IR RADIOLOGIST EVAL & MGMT  02/06/2018  . IR RADIOLOGIST EVAL & MGMT  05/09/2018  . IR US GUIDE VASC ACCESS RIGHT  01/12/2018  . IR US GUIDE VASC ACCESS RIGHT  04/12/2018  . IR VENO/EXT/UNI RIGHT  01/12/2018  . IR VENO/EXT/UNI RIGHT  04/12/2018  . TUBAL LIGATION      Allergies: Patient has no known allergies.  Medications: Prior to Admission medications   Medication Sig Start Date End Date Taking? Authorizing Provider   acetaminophen (TYLENOL) 500 MG tablet Take 500 mg by mouth every 8 (eight) hours as needed for moderate pain.    [provider]  citalopram (CELEXA) 10 MG tablet Take 10 mg by mouth daily.     [provider]  HYDROcodone-acetaminophen (NORCO) 5-325 MG tablet Take 1 tablet by mouth every 6 (six) hours as needed for moderate pain. Patient not taking: Reported on 05/09/2018 04/12/18   Ardis Rowan, PA-C  losartan-hydrochlorothiazide [facility]) 100-25 MG tablet Take 1 tablet by mouth daily.     [provider]  naproxen sodium (ALEVE) 220 MG tablet Take 220-440 mg by mouth daily as needed.     [provider]  potassium chloride SA (K-DUR,KLOR-CON) 20 MEQ tablet Take 20 mEq by mouth daily.     [provider]  predniSONE (STERAPRED UNI-PAK 21 TAB) 10 MG (21) TBPK tablet Take as directed on pack until completed 04/12/18   Ardis Rowan, PA-C  Wheat Dextrin (BENEFIBER) POWD Take 1 Scoop by mouth every other day.    [provider]     Family History  Problem Relation Age of Onset  . Diabetes Mother   . Heart attack Mother   . Hypertension Father     Social History   Socioeconomic History  . Marital status: Married    Spouse name: Not on file  . Number  of children: Not on file  . Years of education: Not on file  . Highest education level: Not on file  Occupational History  . Not on file  Social Needs  . Financial resource strain: Not on file  . Food insecurity    Worry: Not on file    Inability: Not on file  . Transportation needs    Medical: Not on file    Non-medical: Not on file  Tobacco Use  . Smoking status: Current Every Day Smoker    Types: Cigarettes  . Smokeless tobacco: Never Used  . Tobacco comment: 2 cigarettes/day  Substance and Sexual Activity  . Alcohol use: Yes    Comment: socially  . Drug use: No  . Sexual activity: Not on file  Lifestyle  . Physical activity    Days per week: Not on file     Minutes per session: Not on file  . Stress: Not on file  Relationships  . Social Herbalist on phone: Not on file    Gets together: Not on file    Attends religious service: Not on file    Active member of club or organization: Not on file    Attends meetings of clubs or organizations: Not on file    Relationship status: Not on file  Other Topics Concern  . Not on file  Social History Narrative  . Not on file       Review of Systems: A 12 point ROS discussed and pertinent positives are indicated in the HPI above.  All other systems are negative.  Review of Systems  Vital Signs: BP 124/82 (BP Location: Right Arm)   Pulse 83   Temp 97.7 F (36.5 C)   SpO2 100%   Physical Exam Targeted physical exam demonstrated no swelling of the lower extremities.  She has symmetric diameter across the instep bilateral, both about 9cm.    Mallampati Score:     Imaging: No results found.  Labs:  CBC: Recent Labs    01/12/18 0730 03/20/18 0748 04/12/18 0655  WBC 4.6 4.8 5.4  HGB 13.0 13.0 13.4  HCT 41.4 40.1 41.7  PLT 301 263 279    COAGS: Recent Labs    01/12/18 0730 03/20/18 0748 04/12/18 0655  INR 1.17 1.08 1.05  APTT 28  --   --     BMP: Recent Labs    01/12/18 0730 03/20/18 0748 04/12/18 0655  NA 141 142 139  K 3.6 4.3 3.9  CL 108 108 108  CO2 26 26 23   GLUCOSE 92 94 96  BUN 13 15 19   CALCIUM 9.0 9.1 8.6*  CREATININE 0.72 0.72 0.74  GFRNONAA >60 >60 >60  GFRAA >60 >60 >60    LIVER FUNCTION TESTS: No results for input(s): BILITOT, AST, ALT, ALKPHOS, PROT, ALBUMIN in the last 8760 hours.  TUMOR MARKERS: No results for input(s): AFPTM, CEA, CA199, CHROMGRNA in the last 8760 hours.  Assessment and Plan:  Jamie Estrada is 53 yo female with history of a right foot slow flow vascular malformation, venous malformation, treated with targeted sclerotherapy January 2020.    She has had near complete resolution of her symptoms, and seems satisfied  with the result.  She is back to work full time.   At this point, I let her know that we would not require any surveillance imaging, and we would not pursue any further treatment unless her symptoms changed.   She understands.   Plan: - Continue  current conservative management strategies, with compression and comfortable shoes/footwear - She can call us for any needed follow up, but we will not schedule a follow up at this time.     Electronically Signed: Corrie Mckusick 10/11/2018, 11:29 AM   I spent a total of    25 Minutes in face to face in clinical consultation, greater than 50% of which was counseling/coordinating care for right foot venous malformation, SP sclerotherapy.

## 2019-09-04 IMAGING — XA US INJEC SCLEROTHERAPY SINGLE
4 series · 15 of 17 positions shown · non-contrast
Comparison: none

CLINICAL DATA: 52-year-old female with a history of low flow
vascular malformation of the right foot, significantly symptomatic.
INDICATION: Significant right lower extremity pain

[Series 1: fl (-) angio · 4 of 145 frames shown (1 of 3)]
[frame 8/145]
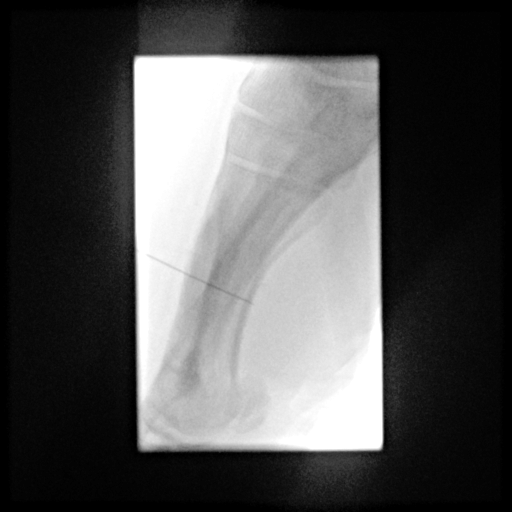
[frame 22/145]
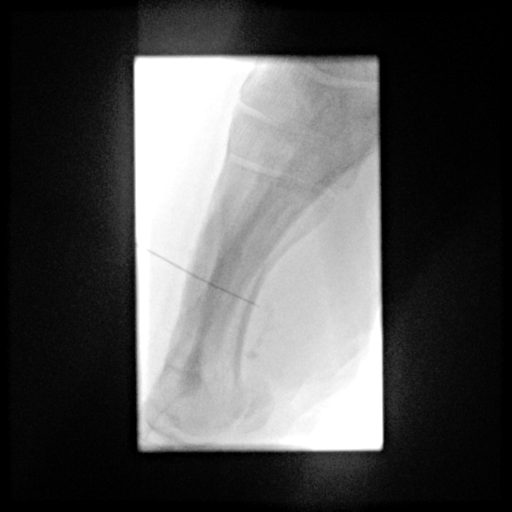
[frame 73/145]
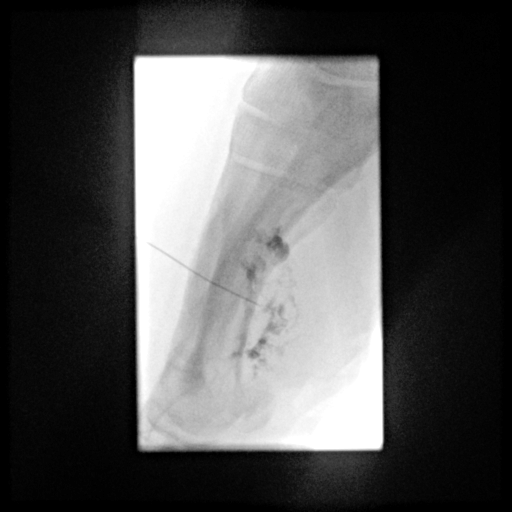
[frame 124/145]
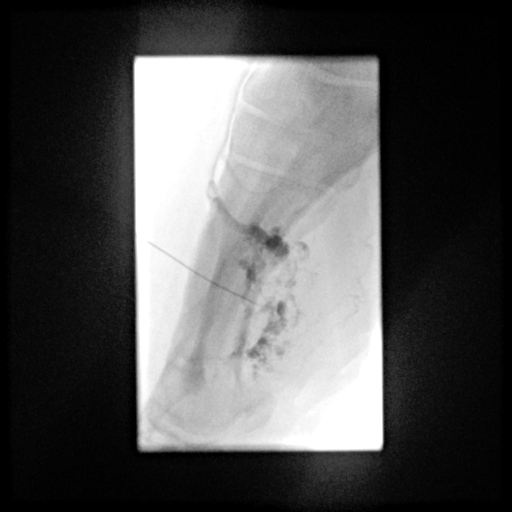

[Series 6: fl (-) angio · 3 of 33 frames shown (2 of 3)]
[frame 5/33]
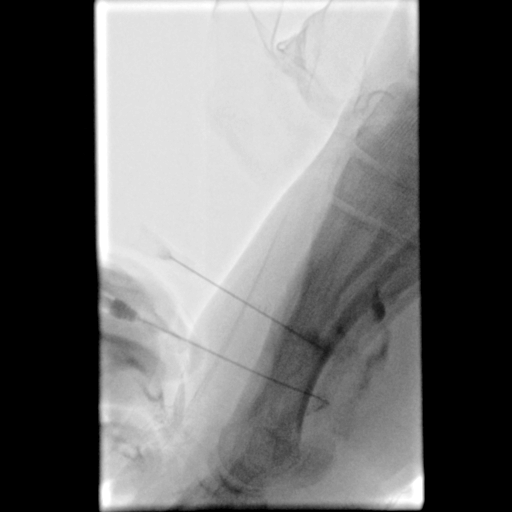
[frame 17/33]
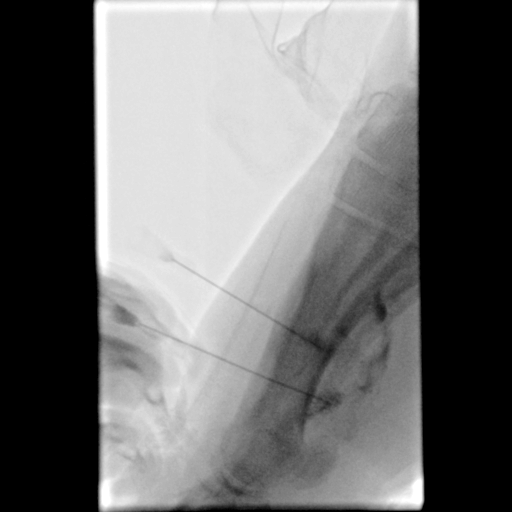
[frame 29/33]
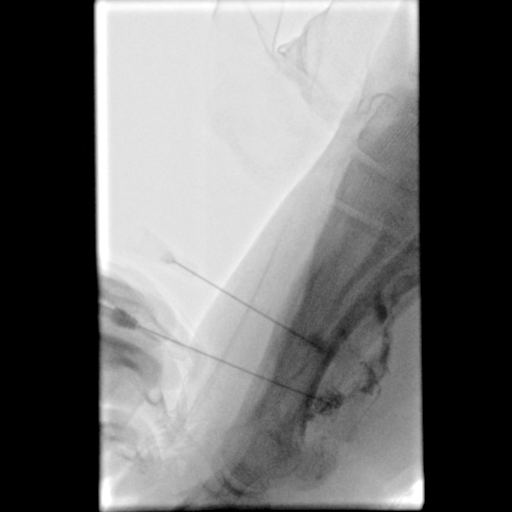

[Series 7: fl (-) angio · 4 of 32 frames shown (3 of 3)]
[frame 2/32]
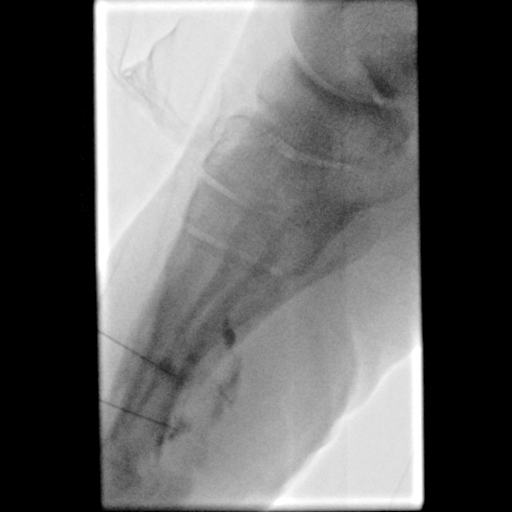
[frame 5/32]
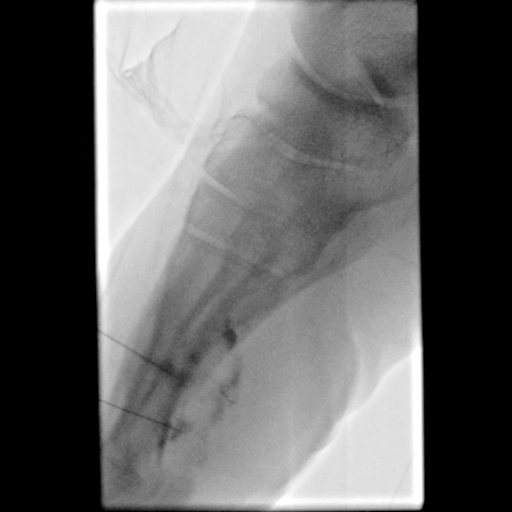
[frame 17/32]
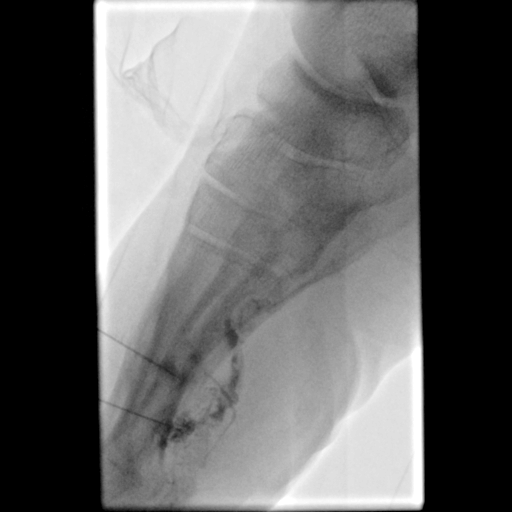
[frame 28/32]
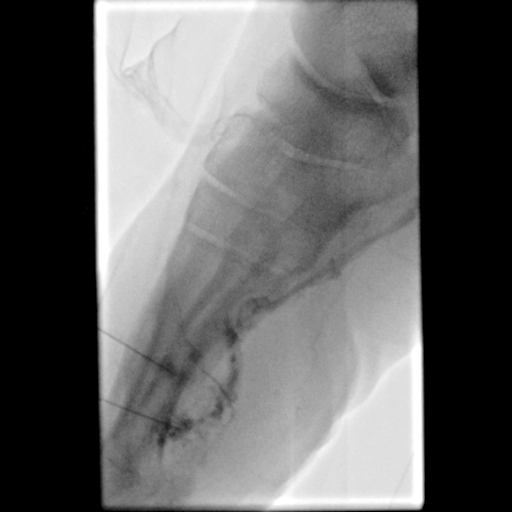

[Series 300: ir (person_name)/ext/uni right · 4 of 5 slices shown]
[im 2/5]
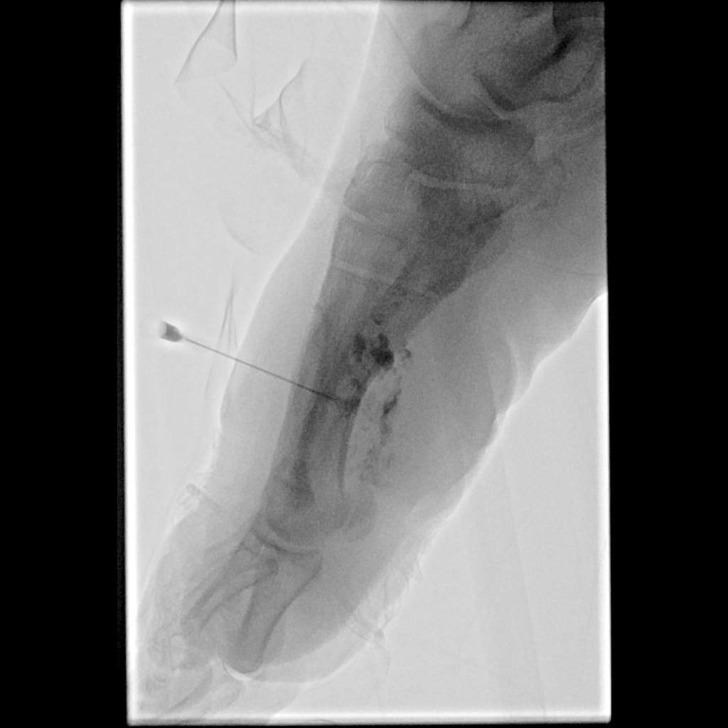
[im 3/5]
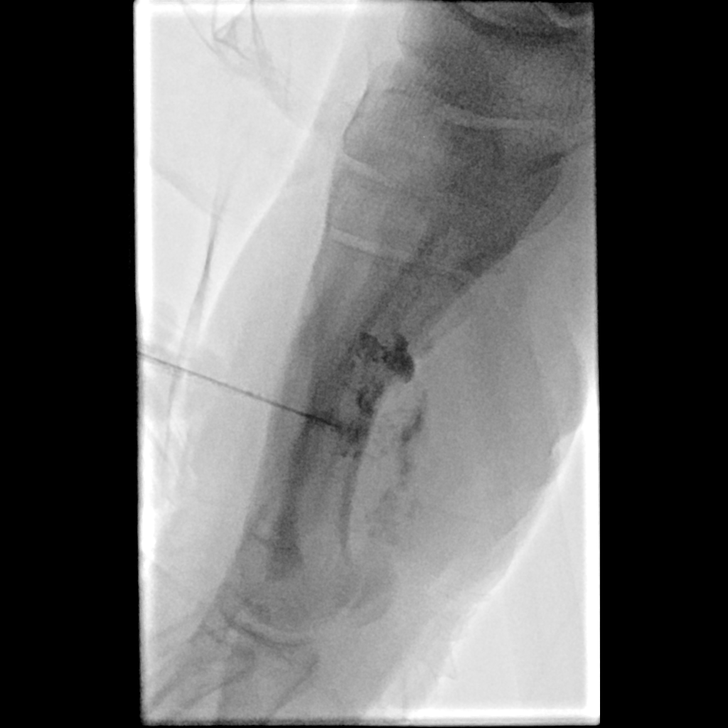
[im 4/5]
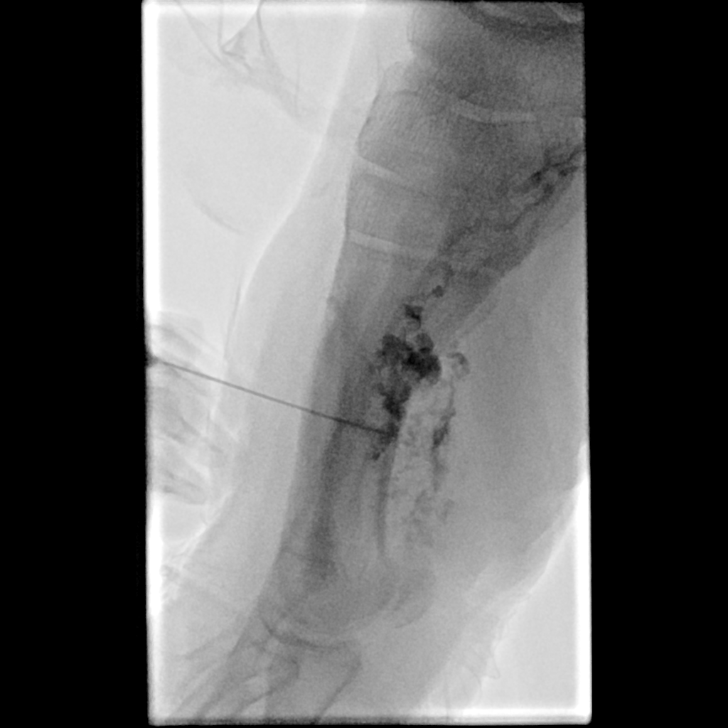
[im 5/5]
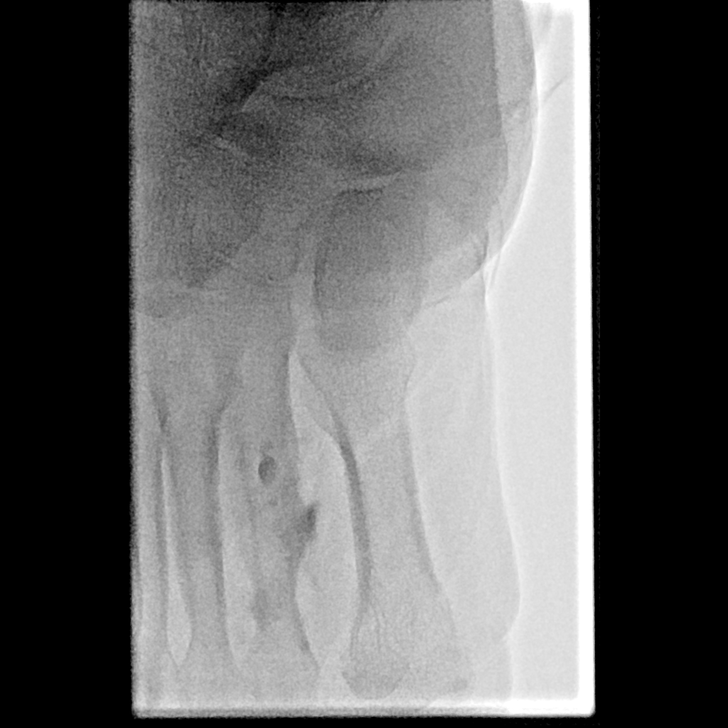

[15 of 17 positions shown; findings below may reference images not displayed]

EXAM:
FLUOROSCOPIC GUIDED PERCUTANEOUS PUNCTURE OF RIGHT FOOT VASCULAR
MALFORMATION

SCLEROTHERAPY

MEDICATIONS:
MEDICATIONS
IV Solu-Medrol 125 mg

ANESTHESIA/SEDATION:
Versed 3.5 mg IV; Fentanyl 150 mcg IV

Moderate Sedation Time:  35 minutes

The patient was continuously monitored during the procedure by the
interventional radiology nurse under my direct supervision.

COMPLICATIONS:
None

PROCEDURE:
Informed written consent was obtained from the patient after a
thorough discussion of the procedural risks, benefits and
alternatives. All questions were addressed. Maximal Sterile Barrier
Technique was utilized including caps, mask, sterile gowns, sterile
gloves, sterile drape, hand hygiene and skin antiseptic. A timeout
was performed prior to the initiation of the procedure.

Patient positioned supine position on the fluoroscopy table. The
right foot was prepped and draped in the usual sterile fashion.

Fluoroscopic images were used for access. 1% lidocaine was used for
local anesthesia. Using fluoroscopic guidance, a 25 gauge needle was
used to puncture the venous malformation near the second metatarsal.
The initial access was performed between the first and second
metatarsal, positioning the needle within the venous lakes at the
deep aspect of the bone.

We confirmed location with infusion of contrast into the venous
lakes.

Before treatment, a tourniquet was applied on the forefoot at the
ankle, confirming outflow was controlled through the draining veins
of the dorsal foot. Once we confirmed that we had controlled the
venous outflow, foam detdetergent was infused using 4 cc gas and 2
cc sotradecol.

We then punctured a second site between the second and third
metatarsal more distally confirming location the needle with
contrast infusion. We applied a tourniquet at the hindfoot for
outflow control and infused small volume of so tree deck all.

Total volume of sotradecol in foam was 2 cc.

The tourniquets were applied for 10 minutes 4 decompression and
safety. Needles were removed.

Patient tolerated the procedure well and remained hemodynamically
stable throughout.

No complications were encountered and no significant blood loss.
IMPRESSION: Status post fluoroscopic puncture of right foot venous malformation
with treatment using foam sclerotherapy.

## 2023-01-21 ENCOUNTER — Ambulatory Visit
Admission: EM | Admit: 2023-01-21 | Discharge: 2023-01-21 | Disposition: A | Discharge status: Home / Self Care | Payer: BC Managed Care – PPO | Attending: Physician Assistant | Admitting: Physician Assistant

## 2023-01-21 ENCOUNTER — Encounter: Payer: Self-pay | Admitting: Emergency Medicine

## 2023-01-21 DIAGNOSIS — R1084 Generalized abdominal pain: Secondary | ICD-10-CM

## 2023-01-21 DIAGNOSIS — R112 Nausea with vomiting, unspecified: Secondary | ICD-10-CM

## 2023-01-21 DIAGNOSIS — K589 Irritable bowel syndrome without diarrhea: Secondary | ICD-10-CM

## 2023-01-21 LAB — CBC WITH DIFFERENTIAL/PLATELET
Abs Immature Granulocytes: 0.01 10*3/uL (ref 0.00–0.07)
Basophils Absolute: 0 10*3/uL (ref 0.0–0.1)
Basophils Relative: 0 %
Eosinophils Absolute: 0.1 10*3/uL (ref 0.0–0.5)
Eosinophils Relative: 1 %
HCT: 39.7 % (ref 36.0–46.0)
Hemoglobin: 13.2 g/dL (ref 12.0–15.0)
Immature Granulocytes: 0 %
Lymphocytes Relative: 31 %
Lymphs Abs: 1.7 10*3/uL (ref 0.7–4.0)
MCH: 28.8 pg (ref 26.0–34.0)
MCHC: 33.2 g/dL (ref 30.0–36.0)
MCV: 86.7 fL (ref 80.0–100.0)
Monocytes Absolute: 0.6 10*3/uL (ref 0.1–1.0)
Monocytes Relative: 11 %
Neutro Abs: 3.2 10*3/uL (ref 1.7–7.7)
Neutrophils Relative %: 57 %
Platelets: 239 10*3/uL (ref 150–400)
RBC: 4.58 MIL/uL (ref 3.87–5.11)
RDW: 14.5 % (ref 11.5–15.5)
WBC: 5.6 10*3/uL (ref 4.0–10.5)
nRBC: 0 % (ref 0.0–0.2)

## 2023-01-21 LAB — COMPREHENSIVE METABOLIC PANEL
ALT: 18 U/L (ref 0–44)
AST: 19 U/L (ref 15–41)
Albumin: 3.9 g/dL (ref 3.5–5.0)
Alkaline Phosphatase: 54 U/L (ref 38–126)
Anion gap: 8 (ref 5–15)
BUN: 15 mg/dL (ref 6–20)
CO2: 24 mmol/L (ref 22–32)
Calcium: 8.7 mg/dL — ABNORMAL LOW (ref 8.9–10.3)
Chloride: 105 mmol/L (ref 98–111)
Creatinine, Ser: 0.69 mg/dL (ref 0.44–1.00)
GFR, Estimated: >60 mL/min (ref >60)
Glucose, Bld: 108 mg/dL — ABNORMAL HIGH (ref 70–99)
Potassium: 3.6 mmol/L (ref 3.5–5.1)
Sodium: 137 mmol/L (ref 135–145)
Total Bilirubin: 0.8 mg/dL (ref <1.2)
Total Protein: 7.4 g/dL (ref 6.5–8.1)

## 2023-01-21 LAB — LIPASE, BLOOD: Lipase: 29 U/L (ref 11–51)

## 2023-01-21 MED ORDER — LIDOCAINE VISCOUS HCL 2 % MT SOLN
15.0000 mL | Freq: Once | OROMUCOSAL | Status: AC
  Administered 2023-01-21: 15 mL via ORAL

## 2023-01-21 MED ORDER — ONDANSETRON 4 MG PO TBDP: 4.0000 mg | ORAL_TABLET | Freq: Three times a day (TID) | ORAL | 0 refills | Status: AC | PRN

## 2023-01-21 MED ORDER — ALUM & MAG HYDROXIDE-SIMETH 200-200-20 MG/5ML PO SUSP
30.0000 mL | Freq: Once | ORAL | Status: AC
  Administered 2023-01-21: 30 mL via ORAL

## 2023-01-21 MED ORDER — ONDANSETRON 8 MG PO TBDP
8.0000 mg | ORAL_TABLET | Freq: Once | ORAL | Status: AC
  Administered 2023-01-21: 8 mg via ORAL

## 2023-01-21 MED ORDER — DICYCLOMINE HCL 20 MG PO TABS: 20.0000 mg | ORAL_TABLET | Freq: Three times a day (TID) | ORAL | 0 refills | Status: AC

## 2023-01-21 NOTE — ED Provider Notes (Signed)
[facility]    CSN: [id number] Arrival date & time: 01/21/23  1257      History   Chief Complaint Chief Complaint  Patient presents with   Diarrhea   Abdominal Pain    HPI Jamie Estrada is a 57 y.o. female.   Patient reports abdominal pain x 3 days. Feels "hot" when she lays down at night. Has nausea and vomiting at night. States she is throwing up acid at night. Has had diarrhea in the mornings---one to two times.   She says laying down makes the pain worse. Nothing really makes pain better other than water sometimes.   Patient reports taking Maalox and Pepto Bismol without relief. Has not tried anything else.   Recently returned from [place]. States she ate a lot of seafood. Denies any other sick contacts.   Denies cough, congestion, sore throat. No urinary symptoms. Denies dysuria, urgency, hematuria, flank pain.  Has history of IBS.  HPI  Past Medical History:  Diagnosis Date   Hypertension     Patient Active Problem List   Diagnosis Date Noted   Pain in joint involving ankle and foot 02/06/2018   Carpal tunnel syndrome 02/06/2018   Pathological fracture 02/06/2018   Radial styloid tenosynovitis 02/06/2018   Situational mixed anxiety and depressive disorder 11/02/2017   Allergic rhinitis, seasonal 07/07/2016   Irritable bowel syndrome with constipation 02/26/2016   Obesity (BMI 30.0-34.9) 02/26/2016   Diuretic-induced hypokalemia 12/12/2013   Essential hypertension 12/07/2012   Menopause 12/07/2010   Tobacco dependence 12/07/2010    Past Surgical History:  Procedure Laterality Date   CARPAL TUNNEL RELEASE     IR RADIOLOGIST EVAL & MGMT  12/14/2017   IR RADIOLOGIST EVAL & MGMT  02/06/2018   IR RADIOLOGIST EVAL & MGMT  05/09/2018   IR RADIOLOGIST EVAL & MGMT  10/11/2018   IR US GUIDE VASC ACCESS RIGHT  01/12/2018   IR US GUIDE VASC ACCESS RIGHT  04/12/2018   IR VENO/EXT/UNI RIGHT  01/12/2018   IR VENO/EXT/UNI RIGHT  04/12/2018   TUBAL LIGATION       OB History   No obstetric history on file.      Home Medications    Prior to Admission medications   Medication Sig Start Date End Date Taking? Authorizing Provider  amLODipine (NORVASC) 5 MG tablet Take by mouth. 12/15/22 03/15/23 Yes [provider]  busPIRone (BUSPAR) 5 MG tablet Take 5 mg by mouth 2 (two) times daily. 12/15/22 03/15/23 Yes [provider]  dicyclomine (BENTYL) 20 MG tablet Take 1 tablet (20 mg total) by mouth 4 (four) times daily -  before meals and at bedtime. 01/21/23  Yes Shirlee Latch, PA-C  losartan-hydrochlorothiazide (HYZAAR) 100-25 MG tablet Take 1 tablet by mouth daily.    Yes [provider]  ondansetron (ZOFRAN-ODT) 4 MG disintegrating tablet Take 1 tablet (4 mg total) by mouth every 8 (eight) hours as needed for nausea or vomiting. 01/21/23  Yes Eusebio Friendly B, PA-C  potassium chloride SA (K-DUR,KLOR-CON) 20 MEQ tablet Take 20 mEq by mouth daily.    Yes [provider]  acetaminophen (TYLENOL) 500 MG tablet Take 500 mg by mouth every 8 (eight) hours as needed for moderate pain.    [provider]  citalopram (CELEXA) 10 MG tablet Take 10 mg by mouth daily.    [provider]  HYDROcodone-acetaminophen (NORCO) 5-325 MG tablet Take 1 tablet by mouth every 6 (six) hours as needed for moderate pain. Patient not  taking: Reported on 05/09/2018 04/12/18   Gershon Crane, PA-C  naproxen sodium (ALEVE) 220 MG tablet Take 220-440 mg by mouth daily as needed.     [provider]  predniSONE (STERAPRED UNI-PAK 21 TAB) 10 MG (21) TBPK tablet Take as directed on pack until completed 04/12/18   Gershon Crane, PA-C  Wheat Dextrin (BENEFIBER) POWD Take 1 Scoop by mouth every other day.    [provider]    Family History Family History  Problem Relation Age of Onset   Diabetes Mother    Heart attack Mother    Hypertension Father     Social History Social History   Tobacco Use    Smoking status: Every Day    Types: Cigarettes   Smokeless tobacco: Never   Tobacco comments:    2 cigarettes/day  Vaping Use   Vaping status: Never Used  Substance Use Topics   Alcohol use: Yes    Comment: socially   Drug use: No     Allergies   Patient has no known allergies.   Review of Systems Review of Systems  Constitutional:  Negative for appetite change, fatigue and fever.  HENT:  Negative for congestion.   Respiratory:  Negative for cough and shortness of breath.   Cardiovascular:  Negative for chest pain.  Gastrointestinal:  Positive for abdominal pain, diarrhea, nausea and vomiting. Negative for constipation.  Genitourinary:  Negative for difficulty urinating, dysuria, frequency and pelvic pain.  Musculoskeletal:  Negative for myalgias.  Neurological:  Negative for weakness and headaches.     Physical Exam Triage Vital Signs ED Triage Vitals  Encounter Vitals Group     BP 01/21/23 1336 (!) 159/97     Systolic BP Percentile --      Diastolic BP Percentile --      Pulse Rate 01/21/23 1336 78     Resp 01/21/23 1336 15     Temp 01/21/23 1336 98.2 F (36.8 C)     Temp Source 01/21/23 1336 Oral     SpO2 01/21/23 1336 100 %     Weight 01/21/23 1334 180 lb (81.6 kg)     Height 01/21/23 1334 5\' 5"  (1.651 m)     Head Circumference --      Peak Flow --      Pain Score 01/21/23 1333 10     Pain Loc --      Pain Education --      Exclude from Growth Chart --    No data found.  Updated Vital Signs BP (!) 159/97 (BP Location: Left Arm)   Pulse 78   Temp 98.2 F (36.8 C) (Oral)   Resp 15   Ht 5\' 5"  (1.651 m)   Wt 180 lb (81.6 kg)   SpO2 100%   BMI 29.95 kg/m     Physical Exam Vitals and nursing note reviewed.  Constitutional:      General: She is not in acute distress.    Appearance: Normal appearance. She is not ill-appearing or toxic-appearing.  HENT:     Head: Normocephalic and atraumatic.     Nose: Nose normal.     Mouth/Throat:     Mouth:  Mucous membranes are moist.     Pharynx: Oropharynx is clear.  Eyes:     General: No scleral icterus.       Right eye: No discharge.        Left eye: No discharge.     Conjunctiva/sclera: Conjunctivae normal.  Cardiovascular:  Rate and Rhythm: Normal rate and regular rhythm.     Heart sounds: Normal heart sounds.  Pulmonary:     Effort: Pulmonary effort is normal. No respiratory distress.     Breath sounds: Normal breath sounds.  Abdominal:     Palpations: Abdomen is soft.     Tenderness: There is generalized abdominal tenderness (generalized). There is no right CVA tenderness, left CVA tenderness, guarding or rebound.  Musculoskeletal:     Cervical back: Neck supple.  Skin:    General: Skin is dry.  Neurological:     General: No focal deficit present.     Mental Status: She is alert. Mental status is at baseline.     Motor: No weakness.     Gait: Gait normal.  Psychiatric:        Mood and Affect: Mood normal.        Behavior: Behavior normal.        Thought Content: Thought content normal.      UC Treatments / Results  Labs (all labs ordered are listed, but only abnormal results are displayed) Labs Reviewed  COMPREHENSIVE METABOLIC PANEL - Abnormal; Notable for the following components:      Result Value   Glucose, Bld 108 (*)    Calcium 8.7 (*)    All other components within normal limits  CBC WITH DIFFERENTIAL/PLATELET  LIPASE, BLOOD    EKG   Radiology No results found.  Procedures Procedures (including critical care time)  Medications Ordered in UC Medications  alum & mag hydroxide-simeth (MAALOX/MYLANTA) 200-200-20 MG/5ML suspension 30 mL (30 mLs Oral Given 01/21/23 1422)    And  lidocaine (XYLOCAINE) 2 % viscous mouth solution 15 mL (15 mLs Oral Given 01/21/23 1422)  ondansetron (ZOFRAN-ODT) disintegrating tablet 8 mg (8 mg Oral Given 01/21/23 1421)    Initial Impression / Assessment and Plan / UC Course  I have reviewed the triage vital signs and  the nursing notes.  Pertinent labs & imaging results that were available during my care of the patient were reviewed by me and considered in my medical decision making (see chart for details).   57 y/o female presents for 3 to 4-day history of abdominal cramping, nausea and vomiting at night, diarrhea a couple times a day.  Has taken Pepto-Bismol and Maalox without relief.  Denies any associated fever.  Symptoms are not getting better or worse from onset.  Patient is afebrile and overall well-appearing.  No acute distress.  On exam has mild to moderate generalized abdominal tenderness.  No guarding, rebound or CVA tenderness.  CBC, CMP and lipase obtained.  Patient to be given GI cocktail and 8 mg ODT Zofran to see if that helps with her symptoms.  Normal CBC.  CMP reassuring.  Lipase normal.  Patient reported relief of symptoms with medications given in clinic.  Unsure as to the cause of her symptoms but could be related to IBS given her history.  Sent to Zofran and Bentyl to pharmacy.  Encouraged increasing rest and fluids.  She reports occasional constipation asked what to use.  Advised her to try senna.  Encouraged her to follow-up with PCP if symptoms are continuing and not improving.  ED precautions discussed.   Final Clinical Impressions(s) / UC Diagnoses   Final diagnoses:  Generalized abdominal pain  Nausea and vomiting, unspecified vomiting type  Irritable bowel syndrome, unspecified type     Discharge Instructions      -Your labs are all reassuring. - I sent  medication to the pharmacy to help with nausea called Zofran.  I sent dicyclomine or Bentyl to pharmacy to help with abdominal cramping that you can take 3-4 times a day as needed. - If you need something for constipation consider senna or senna-S. - Increase rest and fluids. - Fine to continue Maalox if you think that is helpful.  Also consider use of Gas-X if you feel that you are having a lot of gas or bloating. -  If you develop a fever or have worsening abdominal pain follow-up with the emergency department immediately. - If your symptoms are not really going away but are not necessarily worsening follow-up with your primary care provider.     ED Prescriptions     Medication Sig Dispense Auth. Provider   ondansetron (ZOFRAN-ODT) 4 MG disintegrating tablet Take 1 tablet (4 mg total) by mouth every 8 (eight) hours as needed for nausea or vomiting. 15 tablet Eusebio Friendly B, PA-C   dicyclomine (BENTYL) 20 MG tablet Take 1 tablet (20 mg total) by mouth 4 (four) times daily -  before meals and at bedtime. 20 tablet Gareth Morgan      PDMP not reviewed this encounter.   Shirlee Latch, PA-C 01/21/23 1505

## 2023-01-21 NOTE — Discharge Instructions (Signed)
-  Your labs are all reassuring. - I sent medication to the pharmacy to help with nausea called Zofran.  I sent dicyclomine or Bentyl to pharmacy to help with abdominal cramping that you can take 3-4 times a day as needed. - If you need something for constipation consider senna or senna-S. - Increase rest and fluids. - Fine to continue Maalox if you think that is helpful.  Also consider use of Gas-X if you feel that you are having a lot of gas or bloating. - If you develop a fever or have worsening abdominal pain follow-up with the emergency department immediately. - If your symptoms are not really going away but are not necessarily worsening follow-up with your primary care provider.

## 2023-01-21 NOTE — ED Triage Notes (Signed)
Patient c/o upper abdominal pain, diarrhea, and nausea that started on Tuesday.  Patient states that she got back from [place] on Tuesday.  Patient unsure of fevers.

## 2024-03-23 ENCOUNTER — Ambulatory Visit
Admission: EM | Admit: 2024-03-23 | Discharge: 2024-03-23 | Disposition: A | Discharge status: Home / Self Care | Attending: Family Medicine | Admitting: Family Medicine

## 2024-03-23 DIAGNOSIS — R55 Syncope and collapse: Secondary | ICD-10-CM | POA: Diagnosis not present

## 2024-03-23 DIAGNOSIS — S0993XA Unspecified injury of face, initial encounter: Secondary | ICD-10-CM | POA: Diagnosis not present

## 2024-03-23 DIAGNOSIS — S0181XA Laceration without foreign body of other part of head, initial encounter: Secondary | ICD-10-CM

## 2024-03-23 DIAGNOSIS — S0990XA Unspecified injury of head, initial encounter: Secondary | ICD-10-CM | POA: Diagnosis not present
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
## 2024-03-23 NOTE — Discharge Instructions (Signed)
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
